# Patient Record
Sex: Male | Born: 1980 | Race: Black or African American | Hispanic: No | Marital: Married | Smoking: Never smoker
Health system: Southern US, Community
[De-identification: ages and names within clinical notes are randomized; demographics above are authoritative.]

## PROBLEM LIST (undated history)

## (undated) DIAGNOSIS — I10 Essential (primary) hypertension: Secondary | ICD-10-CM

## (undated) DIAGNOSIS — R079 Chest pain, unspecified: Principal | ICD-10-CM

## (undated) DIAGNOSIS — I319 Disease of pericardium, unspecified: Secondary | ICD-10-CM

## (undated) HISTORY — DX: Disease of pericardium, unspecified: I31.9

## (undated) HISTORY — DX: Chest pain, unspecified: R07.9

## (undated) HISTORY — PX: NO PAST SURGERIES: SHX2092

---

## 2014-10-24 ENCOUNTER — Emergency Department (HOSPITAL_COMMUNITY)
Admission: EM | Admit: 2014-10-24 | Discharge: 2014-10-24 | Disposition: A | Discharge status: Home / Self Care | Payer: Medicaid Other | Attending: Emergency Medicine | Admitting: Emergency Medicine

## 2014-10-24 ENCOUNTER — Encounter (HOSPITAL_COMMUNITY): Payer: Self-pay | Admitting: Emergency Medicine

## 2014-10-24 DIAGNOSIS — I10 Essential (primary) hypertension: Secondary | ICD-10-CM | POA: Insufficient documentation

## 2014-10-24 DIAGNOSIS — L299 Pruritus, unspecified: Secondary | ICD-10-CM | POA: Diagnosis present

## 2014-10-24 HISTORY — DX: Essential (primary) hypertension: I10

## 2014-10-24 LAB — CBC WITH DIFFERENTIAL/PLATELET
BASOS ABS: 0 10*3/uL (ref 0.0–0.1)
Basophils Relative: 0 %
EOS PCT: 22 %
Eosinophils Absolute: 1.1 10*3/uL — ABNORMAL HIGH (ref 0.0–0.7)
HEMATOCRIT: 43 % (ref 39.0–52.0)
HEMOGLOBIN: 14.8 g/dL (ref 13.0–17.0)
LYMPHS ABS: 2.5 10*3/uL (ref 0.7–4.0)
LYMPHS PCT: 53 %
MCH: 27.3 pg (ref 26.0–34.0)
MCHC: 34.4 g/dL (ref 30.0–36.0)
MCV: 79.3 fL (ref 78.0–100.0)
MONOS PCT: 7 %
Monocytes Absolute: 0.3 10*3/uL (ref 0.1–1.0)
NEUTROS PCT: 18 %
Neutro Abs: 0.9 10*3/uL — ABNORMAL LOW (ref 1.7–7.7)
Platelets: 228 10*3/uL (ref 150–400)
RBC: 5.42 MIL/uL (ref 4.22–5.81)
RDW: 12.9 % (ref 11.5–15.5)
WBC: 4.8 10*3/uL (ref 4.0–10.5)

## 2014-10-24 LAB — BASIC METABOLIC PANEL
ANION GAP: 8 (ref 5–15)
BUN: 11 mg/dL (ref 6–20)
CALCIUM: 9.2 mg/dL (ref 8.9–10.3)
CHLORIDE: 100 mmol/L — AB (ref 101–111)
CO2: 28 mmol/L (ref 22–32)
Creatinine, Ser: 0.9 mg/dL (ref 0.61–1.24)
GFR calc non Af Amer: >60 mL/min (ref >60)
GLUCOSE: 88 mg/dL (ref 65–99)
POTASSIUM: 3.6 mmol/L (ref 3.5–5.1)
Sodium: 136 mmol/L (ref 135–145)

## 2014-10-24 LAB — URINALYSIS, ROUTINE W REFLEX MICROSCOPIC
BILIRUBIN URINE: NEGATIVE
Glucose, UA: NEGATIVE mg/dL
Hgb urine dipstick: NEGATIVE
Ketones, ur: NEGATIVE mg/dL
LEUKOCYTES UA: NEGATIVE
NITRITE: NEGATIVE
PH: 6.5 (ref 5.0–8.0)
Protein, ur: NEGATIVE mg/dL
SPECIFIC GRAVITY, URINE: 1.023 (ref 1.005–1.030)
UROBILINOGEN UA: 0.2 mg/dL (ref 0.0–1.0)

## 2014-10-24 MED ORDER — PREDNISONE 10 MG PO TABS: 20.0000 mg | ORAL_TABLET | Freq: Two times a day (BID) | ORAL | Status: DC

## 2014-10-24 MED ORDER — PREDNISONE 20 MG PO TABS
60.0000 mg | ORAL_TABLET | Freq: Once | ORAL | Status: AC
  Administered 2014-10-24: 60 mg via ORAL
  Filled 2014-10-24: qty 3

## 2014-10-24 MED ORDER — HYDROXYZINE HCL 25 MG PO TABS: 25.0000 mg | ORAL_TABLET | Freq: Four times a day (QID) | ORAL | Status: DC

## 2014-10-24 MED ORDER — DIPHENHYDRAMINE HCL 25 MG PO CAPS
25.0000 mg | ORAL_CAPSULE | Freq: Once | ORAL | Status: AC
  Administered 2014-10-24: 25 mg via ORAL
  Filled 2014-10-24: qty 1

## 2014-10-24 NOTE — ED Notes (Signed)
Pt is with children in peds

## 2014-10-24 NOTE — ED Provider Notes (Signed)
CSN: [id number]     Arrival date & time 10/24/14  1329 History  By signing my name below, I, Peter Malone, attest that this documentation has been prepared under the direction and in the presence of Peter Baller, NP Electronically Signed: Erling Malone, ED Scribe. 10/26/2014. 8:56 PM.    Chief Complaint  Patient presents with  . Pruritis   The history is provided by the patient. No language interpreter was used.    HPI Comments: Peter Malone is a 34 y.o. male who presents to the Emergency Department complaining of itchy skin to his back onset 1 day. He states the rash is also located to his b/l upper arms, torso and feet. He began taking his new blood pressure medication 2 weeks ago; pt is unable to recall name of medication. He denies any new soaps, detergents or foods. [place] country 2 [place] from [place] and states he was in an area with poor water quality. Pt denies any h/o similar rashes. He denies any lesions, hives, throat swelling, tongue swelling, SOB or wheezing.  Patient's wife is here in the ED tonight with a migraine headache. Patient's daughter has been evaluated for constipation.  When patient arrived here 2 weeks ago he and his family went to a Clinic in [facility] for a check up.    Past Medical History  Diagnosis Date  . Hypertension    History reviewed. No pertinent past surgical history. History reviewed. No pertinent family history. Social History  Substance Use Topics  . Smoking status: Never Smoker   . Smokeless tobacco: None  . Alcohol Use: No    Review of Systems  HENT: Negative for facial swelling and trouble swallowing.   Respiratory: Negative for shortness of breath and wheezing.   Skin: Positive for rash.  All other systems reviewed and are negative.     Allergies  Review of patient's allergies indicates no known allergies.  Home Medications   Prior to Admission medications   Medication Sig Start Date End Date  Taking? Authorizing Provider  hydrOXYzine (ATARAX/VISTARIL) 25 MG tablet Take 1 tablet (25 mg total) by mouth every 6 (six) hours. 10/24/14   Peter Malone Bunnie Pion, NP  predniSONE (DELTASONE) 10 MG tablet Take 2 tablets (20 mg total) by mouth 2 (two) times daily with a meal. 10/24/14   Peter Malone Bunnie Pion, NP   Triage Vitals: BP 136/92 mmHg  Pulse 58  Temp(Src) 97.6 F (36.4 C) (Oral)  Resp 18  SpO2 99%  Physical Exam  Constitutional: He is oriented to person, place, and time. He appears well-developed and well-nourished.  HENT:  Head: Normocephalic and atraumatic.  Mouth/Throat: Uvula is midline and oropharynx is clear and moist. No posterior oropharyngeal edema.  Eyes: EOM are normal.  Neck: Neck supple.  Cardiovascular: Normal rate and regular rhythm.   Pulmonary/Chest: Effort normal and breath sounds normal.  Abdominal: Soft. There is no tenderness.  Musculoskeletal: Normal range of motion.  Neurological: He is alert and oriented to person, place, and time. No cranial nerve deficit.  Skin: Skin is warm and dry.  No rash noted on exam but patient scratching and complaining of itching.  Psychiatric: He has a normal mood and affect. His behavior is normal.  Nursing note and vitals reviewed.   ED Course  Procedures (including critical care time)  DIAGNOSTIC STUDIES: Oxygen Saturation is 99% on RA, normal by my interpretation.    COORDINATION OF CARE:    Labs Review Results for orders  placed or performed during the hospital encounter of 10/24/14 (from the past 72 hour(s))  CBC with Differential     Status: Abnormal   Collection Time: 10/24/14  4:26 PM  Result Value Ref Range   WBC 4.8 4.0 - 10.5 K/uL   RBC 5.42 4.22 - 5.81 MIL/uL   Hemoglobin 14.8 13.0 - 17.0 g/dL   HCT 43.0 39.0 - 52.0 %   MCV 79.3 78.0 - 100.0 fL   MCH 27.3 26.0 - 34.0 pg   MCHC 34.4 30.0 - 36.0 g/dL   RDW 12.9 11.5 - 15.5 %   Platelets 228 150 - 400 K/uL   Neutrophils Relative % 18 %   Lymphocytes Relative 53  %   Monocytes Relative 7 %   Eosinophils Relative 22 %   Basophils Relative 0 %   Neutro Abs 0.9 (L) 1.7 - 7.7 K/uL   Lymphs Abs 2.5 0.7 - 4.0 K/uL   Monocytes Absolute 0.3 0.1 - 1.0 K/uL   Eosinophils Absolute 1.1 (H) 0.0 - 0.7 K/uL   Basophils Absolute 0.0 0.0 - 0.1 K/uL   Smear Review MORPHOLOGY UNREMARKABLE   Basic metabolic panel     Status: Abnormal   Collection Time: 10/24/14  4:26 PM  Result Value Ref Range   Sodium 136 135 - 145 mmol/L   Potassium 3.6 3.5 - 5.1 mmol/L   Chloride 100 (L) 101 - 111 mmol/L   CO2 28 22 - 32 mmol/L   Glucose, Bld 88 65 - 99 mg/dL   BUN 11 6 - 20 mg/dL   Creatinine, Ser 0.90 0.61 - 1.24 mg/dL   Calcium 9.2 8.9 - 10.3 mg/dL   GFR calc non Af Amer >60 >60 mL/min   GFR calc Af Amer >60 >60 mL/min    Comment: (NOTE) The eGFR has been calculated using the CKD EPI equation. This calculation has not been validated in all clinical situations. eGFR's persistently <60 mL/min signify possible Chronic Kidney Disease.    Anion gap 8 5 - 15  Pathologist smear review     Status: None   Collection Time: 10/24/14  4:26 PM  Result Value Ref Range   Path Review Neutropenia     Comment: and reactive appearing lymphocytes. Reviewed by Peter Malone Lyndon Code, M.D. 10/25/14   Urinalysis, Routine w reflex microscopic     Status: None   Collection Time: 10/24/14  4:50 PM  Result Value Ref Range   Color, Urine YELLOW YELLOW   APPearance CLEAR CLEAR   Specific Gravity, Urine 1.023 1.005 - 1.030   pH 6.5 5.0 - 8.0   Glucose, UA NEGATIVE NEGATIVE mg/dL   Hgb urine dipstick NEGATIVE NEGATIVE   Bilirubin Urine NEGATIVE NEGATIVE   Ketones, ur NEGATIVE NEGATIVE mg/dL   Protein, ur NEGATIVE NEGATIVE mg/dL   Urobilinogen, UA 0.2 0.0 - 1.0 mg/dL   Nitrite NEGATIVE NEGATIVE   Leukocytes, UA NEGATIVE NEGATIVE    Comment: MICROSCOPIC NOT DONE ON URINES WITH NEGATIVE PROTEIN, BLOOD, LEUKOCYTES, NITRITE, OR GLUCOSE <1000 mg/dL.     MDM  35 y.o. male with itching of skin  and report of hives earlier stable for d/c without respiratory distress with O2 SAT 100% on R/A. No difficulty swallowing or other problems. Patient to call the doctor that started him on the BP medication 2 weeks ago and discuss what the medication is and if it could cause the itching. Patient agrees to follow up. Will treat with Atarax for itching and prednisone.   Final diagnoses:  Pruritic condition   I personally performed the services described in this documentation, which was scribed in my presence. The recorded information has been reviewed and is accurate.     E. Lopez, [place] 10/26/14 2103  Debby Freiberg, MD 10/31/14 [phone]

## 2014-10-24 NOTE — ED Notes (Signed)
Pt sts itching and having some drainage and whelp when scratching

## 2014-10-24 NOTE — Discharge Instructions (Signed)
Contact the doctor that gave you your blood pressure pills and see if they want to change them. Return here as needed.   Pruritus Pruritus is an itching feeling. There are many different conditions and factors that can make your skin itchy. Dry skin is one of the most common causes of itching. Most cases of itching do not require medical attention. Itchy skin can turn into a rash.  HOME CARE INSTRUCTIONS  Watch your pruritus for any changes. Take these steps to help with your condition:  Skin Care  Moisturize your skin as needed. A moisturizer that contains petroleum jelly is best for keeping moisture in your skin.  Take or apply medicines only as directed by your health care provider. This may include:  Corticosteroid cream.  Anti-itch lotions.  Oral anti-histamines.  Apply cool compresses to the affected areas.  Try taking a bath with:  Epsom salts. Follow the instructions on the packaging. You can get these at your local pharmacy or grocery store.  Baking soda. Pour a small amount into the bath as directed by your health care provider.  Colloidal oatmeal. Follow the instructions on the packaging. You can get this at your local pharmacy or grocery store.  Try applying baking soda paste to your skin. Stir water into baking soda until it reaches a paste-like consistency.   Do not scratch your skin.  Avoid hot showers or baths, which can make itching worse. A cold shower may help with itching as long as you use a moisturizer after.  Avoid scented soaps, detergents, and perfumes. Use gentle soaps, detergents, perfumes, and other cosmetic products. General Instructions  Avoid wearing tight clothes.  Keep a journal to help track what causes your itch. Write down:  What you eat.  What cosmetic products you use.  What you drink.  What you wear. This includes jewelry.  Use a humidifier. This keeps the air moist, which helps to prevent dry skin. SEEK MEDICAL CARE IF:  The  itching does not go away after several days.  You sweat at night.  You have weight loss.  You are unusually thirsty.  You urinate more than normal.  You are more tired than normal.  You have abdominal pain.  Your skin tingles.  You feel weak.  Your skin or the whites of your eyes look yellow (jaundice).  Your skin feels numb.   This information is not intended to replace advice given to you by your health care provider. Make sure you discuss any questions you have with your health care provider.   Document Released: 09/12/2010 Document Revised: 05/17/2014 Document Reviewed: 12/27/2013 Elsevier Interactive Patient Education [employer]

## 2014-10-25 LAB — PATHOLOGIST SMEAR REVIEW

## 2014-11-22 DIAGNOSIS — I10 Essential (primary) hypertension: Secondary | ICD-10-CM

## 2014-11-22 NOTE — Congregational Nurse Program (Signed)
Congregational Nurse Program Note  Date of Encounter: 11/22/2014  Past Medical History: Past Medical History  Diagnosis Date  . Hypertension     Encounter Details: Office visit at [facility] requesting blood pressure check. History of hypertension treatment and currently not on medication since arrival [place] [address] [place] [employer]. Currently unfamiliar with PCP assigned on Medicaid information. Blood pressure 145/94. Pulse 75. Plan: [facility] to [facility] for primary care. Exercise weekly;walking. No added salt to diet. Return 1 week for B/P recheck.

## 2015-03-08 ENCOUNTER — Emergency Department (INDEPENDENT_AMBULATORY_CARE_PROVIDER_SITE_OTHER): Payer: Medicaid Other

## 2015-03-08 ENCOUNTER — Encounter (HOSPITAL_COMMUNITY): Payer: Self-pay | Admitting: *Deleted

## 2015-03-08 ENCOUNTER — Emergency Department (HOSPITAL_COMMUNITY)
Admission: EM | Admit: 2015-03-08 | Discharge: 2015-03-08 | Disposition: A | Discharge status: Home / Self Care | Payer: Medicaid Other | Source: Home / Self Care | Attending: Family Medicine | Admitting: Family Medicine

## 2015-03-08 DIAGNOSIS — I10 Essential (primary) hypertension: Secondary | ICD-10-CM

## 2015-03-08 DIAGNOSIS — J069 Acute upper respiratory infection, unspecified: Secondary | ICD-10-CM | POA: Diagnosis not present

## 2015-03-08 LAB — POCT I-STAT, CHEM 8
BUN: 11 mg/dL (ref 6–20)
CALCIUM ION: 1.17 mmol/L (ref 1.12–1.23)
CHLORIDE: 102 mmol/L (ref 101–111)
CREATININE: 0.8 mg/dL (ref 0.61–1.24)
GLUCOSE: 84 mg/dL (ref 65–99)
HCT: 45 % (ref 39.0–52.0)
Hemoglobin: 15.3 g/dL (ref 13.0–17.0)
Potassium: 3.3 mmol/L — ABNORMAL LOW (ref 3.5–5.1)
Sodium: 141 mmol/L (ref 135–145)
TCO2: 29 mmol/L (ref 0–100)

## 2015-03-08 MED ORDER — GUAIFENESIN-CODEINE 100-10 MG/5ML PO SYRP: 10.0000 mL | ORAL_SOLUTION | Freq: Four times a day (QID) | ORAL | Status: DC | PRN

## 2015-03-08 MED ORDER — AMLODIPINE BESYLATE 10 MG PO TABS: 10.0000 mg | ORAL_TABLET | Freq: Every day | ORAL | Status: DC

## 2015-03-08 NOTE — ED Notes (Signed)
Pt  Has  A  Cough   - he  Also  Has  A  History    Of hypertension      He  Takes  No  meds        He  Reports  Body  aches  As  Well    He  Reports  The  Symptoms    X  4  Days     He  Is  Sitting  Upright on the  Exam table  Speaking in   Complete  sentances  And  Appears  In no  Severe  Distress

## 2015-03-08 NOTE — Discharge Instructions (Signed)
See your doctor in 1-2 weeks for recheck of bp and cough

## 2015-03-08 NOTE — ED Provider Notes (Signed)
CSN: [id number]     Arrival date & time 03/08/15  1306 History   First MD Initiated Contact with Patient 03/08/15 1331     Chief Complaint  Patient presents with  . URI   (Consider location/radiation/quality/duration/timing/severity/associated sxs/prior Treatment) Patient is a 35 y.o. male presenting with URI. The history is provided by the patient.  URI Presenting symptoms: cough   Presenting symptoms: no fever   Severity:  Moderate Onset quality:  Gradual Duration:  4 days Chronicity:  New Relieved by:  None tried Worsened by:  Nothing tried Ineffective treatments:  None tried Associated symptoms: no wheezing   Risk factors comment:  Hypertension treated in [place], no rx in [place]. no smoking.   Past Medical History  Diagnosis Date  . Hypertension    History reviewed. No pertinent past surgical history. History reviewed. No pertinent family history. Social History  Substance Use Topics  . Smoking status: Never Smoker   . Smokeless tobacco: None  . Alcohol Use: No    Review of Systems  Constitutional: Negative.  Negative for fever.  HENT: Negative.   Respiratory: Positive for cough. Negative for wheezing.   Cardiovascular: Positive for chest pain. Negative for leg swelling.  All other systems reviewed and are negative.   Allergies  Review of patient's allergies indicates no known allergies.  Home Medications   Prior to Admission medications   Medication Sig Start Date End Date Taking? Authorizing Provider  amLODipine (NORVASC) 10 MG tablet Take 1 tablet (10 mg total) by mouth daily. 03/08/15   Linna Hoff, MD  guaiFENesin-codeine [facility]) 100-10 MG/5ML syrup Take 10 mLs by mouth 4 (four) times daily as needed for cough. 03/08/15   Linna Hoff, MD  hydrOXYzine (ATARAX/VISTARIL) 25 MG tablet Take 1 tablet (25 mg total) by mouth every 6 (six) hours. 10/24/14   Hope Orlene Och, NP  predniSONE (DELTASONE) 10 MG tablet Take 2 tablets (20 mg total) by mouth 2 (two)  times daily with a meal. 10/24/14   Hope Orlene Och, NP   Meds Ordered and Administered this Visit  Medications - No data to display  BP 181/91 mmHg  Pulse 72  Temp(Src) 98.2 F (36.8 C) (Oral)  Resp 18  SpO2 100% No data found.   Physical Exam  Constitutional: He is oriented to person, place, and time. He appears well-developed and well-nourished. No distress.  HENT:  Right Ear: External ear normal.  Left Ear: External ear normal.  Mouth/Throat: Oropharynx is clear and moist.  Neck: Normal range of motion. Neck supple.  Cardiovascular: Regular rhythm, normal heart sounds and intact distal pulses.   Pulmonary/Chest: Effort normal.  Musculoskeletal: He exhibits no edema.  Neurological: He is alert and oriented to person, place, and time. He has normal reflexes.  Skin: Skin is warm and dry.  Nursing note and vitals reviewed.   ED Course  Procedures (including critical care time)  Labs Review Labs Reviewed  POCT I-STAT, CHEM 8 - Abnormal; Notable for the following:    Potassium 3.3 (*)    All other components within normal limits   i-stat k3.3  Imaging Review Dg Chest 2 View  03/08/2015  CLINICAL DATA:  Hypertension. EXAM: CHEST  2 VIEW COMPARISON:  None. FINDINGS: Heart size is normal. Mediastinal shadows are normal. The lungs are clear. No bronchial thickening. No infiltrate, mass, effusion or collapse. Pulmonary vascularity is normal. No bony abnormality. IMPRESSION: Normal chest Electronically Signed   By: Paulina Fusi M.D.   On:  03/08/2015 14:07   X-rays reviewed and report per radiologist.   Visual Acuity Review  Right Eye Distance:   Left Eye Distance:   Bilateral Distance:    Right Eye Near:   Left Eye Near:    Bilateral Near:         MDM   1. URI (upper respiratory infection)   2. Essential hypertension    Meds ordered this encounter  Medications  . amLODipine (NORVASC) 10 MG tablet    Sig: Take 1 tablet (10 mg total) by mouth daily.     Dispense:  30 tablet    Refill:  1  . guaiFENesin-codeine (ROBITUSSIN AC) 100-10 MG/5ML syrup    Sig: Take 10 mLs by mouth 4 (four) times daily as needed for cough.    Dispense:  180 mL    Refill:  0       Linna Hoff, MD 03/08/15 1421

## 2015-03-15 DIAGNOSIS — I319 Disease of pericardium, unspecified: Secondary | ICD-10-CM

## 2015-03-15 HISTORY — DX: Disease of pericardium, unspecified: I31.9

## 2015-03-17 ENCOUNTER — Other Ambulatory Visit: Payer: Self-pay

## 2015-03-17 ENCOUNTER — Ambulatory Visit: Payer: Medicaid Other | Attending: Internal Medicine | Admitting: Physician Assistant

## 2015-03-17 ENCOUNTER — Encounter: Payer: Self-pay | Admitting: Physician Assistant

## 2015-03-17 VITALS — BP 147/98 | HR 77 | Temp 97.5°F | Resp 16 | Ht 69.0 in | Wt 172.0 lb

## 2015-03-17 DIAGNOSIS — Z79899 Other long term (current) drug therapy: Secondary | ICD-10-CM | POA: Diagnosis not present

## 2015-03-17 DIAGNOSIS — E876 Hypokalemia: Secondary | ICD-10-CM | POA: Insufficient documentation

## 2015-03-17 DIAGNOSIS — R0782 Intercostal pain: Secondary | ICD-10-CM

## 2015-03-17 DIAGNOSIS — I309 Acute pericarditis, unspecified: Secondary | ICD-10-CM | POA: Insufficient documentation

## 2015-03-17 DIAGNOSIS — J069 Acute upper respiratory infection, unspecified: Secondary | ICD-10-CM | POA: Diagnosis not present

## 2015-03-17 DIAGNOSIS — I10 Essential (primary) hypertension: Secondary | ICD-10-CM | POA: Diagnosis not present

## 2015-03-17 LAB — CBC WITH DIFFERENTIAL/PLATELET
Basophils Absolute: 0 10*3/uL (ref 0.0–0.1)
Basophils Relative: 0 % (ref 0–1)
Eosinophils Absolute: 0.4 10*3/uL (ref 0.0–0.7)
Eosinophils Relative: 9 % — ABNORMAL HIGH (ref 0–5)
HEMATOCRIT: 41.9 % (ref 39.0–52.0)
HEMOGLOBIN: 14.1 g/dL (ref 13.0–17.0)
LYMPHS ABS: 1.6 10*3/uL (ref 0.7–4.0)
LYMPHS PCT: 42 % (ref 12–46)
MCH: 27.2 pg (ref 26.0–34.0)
MCHC: 33.7 g/dL (ref 30.0–36.0)
MCV: 80.9 fL (ref 78.0–100.0)
MONO ABS: 0.4 10*3/uL (ref 0.1–1.0)
MONOS PCT: 10 % (ref 3–12)
MPV: 9.5 fL (ref 8.6–12.4)
NEUTROS ABS: 1.5 10*3/uL — AB (ref 1.7–7.7)
NEUTROS PCT: 39 % — AB (ref 43–77)
PLATELETS: 290 10*3/uL (ref 150–400)
RBC: 5.18 MIL/uL (ref 4.22–5.81)
RDW: 13.6 % (ref 11.5–15.5)
WBC: 3.9 10*3/uL — ABNORMAL LOW (ref 4.0–10.5)

## 2015-03-17 LAB — BASIC METABOLIC PANEL
BUN: 15 mg/dL (ref 7–25)
CALCIUM: 9.5 mg/dL (ref 8.6–10.3)
CHLORIDE: 105 mmol/L (ref 98–110)
CO2: 27 mmol/L (ref 20–31)
Creat: 0.79 mg/dL (ref 0.60–1.35)
GLUCOSE: 80 mg/dL (ref 65–99)
POTASSIUM: 3.6 mmol/L (ref 3.5–5.3)
SODIUM: 139 mmol/L (ref 135–146)

## 2015-03-17 LAB — HEPATIC FUNCTION PANEL
ALT: 15 U/L (ref 9–46)
AST: 30 U/L (ref 10–40)
Albumin: 4.4 g/dL (ref 3.6–5.1)
Alkaline Phosphatase: 64 U/L (ref 40–115)
BILIRUBIN DIRECT: 0.1 mg/dL (ref <=0.2)
BILIRUBIN INDIRECT: 0.5 mg/dL (ref 0.2–1.2)
BILIRUBIN TOTAL: 0.6 mg/dL (ref 0.2–1.2)
Total Protein: 7.3 g/dL (ref 6.1–8.1)

## 2015-03-17 LAB — C-REACTIVE PROTEIN: CRP: 0.7 mg/dL — AB (ref <0.60)

## 2015-03-17 MED ORDER — METHYLPREDNISOLONE SODIUM SUCC 125 MG IJ SOLR: 125.0000 mg | Freq: Once | INTRAMUSCULAR | Status: DC

## 2015-03-17 MED ORDER — COLCHICINE 0.6 MG PO TABS: 0.6000 mg | ORAL_TABLET | Freq: Two times a day (BID) | ORAL | Status: DC

## 2015-03-17 MED ORDER — AMLODIPINE BESYLATE 10 MG PO TABS: 10.0000 mg | ORAL_TABLET | Freq: Every day | ORAL | Status: DC

## 2015-03-17 MED ORDER — MELOXICAM 15 MG PO TABS: 15.0000 mg | ORAL_TABLET | Freq: Every day | ORAL | Status: DC

## 2015-03-17 MED ORDER — IBUPROFEN 800 MG PO TABS: 800.0000 mg | ORAL_TABLET | Freq: Three times a day (TID) | ORAL | Status: DC

## 2015-03-17 MED ORDER — SODIUM CHLORIDE 0.9 % IV SOLN
125.0000 mg | Freq: Once | INTRAVENOUS | Status: AC
  Administered 2015-03-17: 130 mg via INTRAMUSCULAR

## 2015-03-17 NOTE — Progress Notes (Signed)
Peter Malone, is a 35 y.o. male  ZOX:[id number]  [id number]  DOB - February 05, 1980  Chief Complaint  Patient presents with  . URI  . Hypertension  . Follow-up        Subjective:   Peter Malone is a 35 y.o. male here today for a follow up visit from URI and htn diagnosed in the ED 03/08/2015. His URI/coughing is almost completely resolved. He is tolerating amlodipine without adverse SE.  He was found to have K+ of 3.3 at the hospital.  CXR was normal.  I do not see that an EKG was performed.  He is also c/o L sided CP that has been going on for several months.  It is worse with certain movements.  He denies SOB, jaw, or arm pain.  Pain unchanged by position changes. He is also here to establish care.  Swahili interpreter is used by phone. He does not need a refill on his amlodipine currently. His FH is negative for early cardiac events.  No weakness, paresthesias.  Patient has No headache.  No abdominal pain - No Nausea, No new weakness tingling or numbness, No Cough - SOB.     ALLERGIES: No Known Allergies  PAST MEDICAL HISTORY: Past Medical History  Diagnosis Date  . Hypertension     MEDICATIONS AT HOME: Prior to Admission medications   Medication Sig Start Date End Date Taking? Authorizing Provider  amLODipine (NORVASC) 10 MG tablet Take 1 tablet (10 mg total) by mouth daily. 03/17/15  Yes Anders SimmondsAngela M McClung, PA-C  colchicine 0.6 MG tablet Take 1 tablet (0.6 mg total) by mouth 2 (two) times daily. 03/17/15   Anders SimmondsAngela M McClung, PA-C  ibuprofen (ADVIL,MOTRIN) 800 MG tablet Take 1 tablet (800 mg total) by mouth 3 (three) times daily. 03/17/15   Anders SimmondsAngela M McClung, PA-C     Objective:   Filed Vitals:   03/17/15 1028  BP: 147/98  Pulse: 77  Temp: 97.5 F (36.4 C)  TempSrc: Oral  Resp: 16  Height: 5\' 9"  (1.753 m)  Weight: 172 lb (78.019 kg)  SpO2: 100%    Exam General appearance : Awake, alert, not in any distress. Speech Clear. Not toxic looking HEENT: Atraumatic and  Normocephalic, pupils equally reactive to light and accomodation Neck: supple, no JVD. No cervical lymphadenopathy.  Chest:Good air entry bilaterally, no added sounds  CVS: S1 S2 regular, no murmurs. No heave, no thrill.  Abdomen: Bowel sounds present, Non tender and not distended with no gaurding, rigidity or rebound. Extremities: B/L Lower Ext shows no edema, both legs are warm to touch Neurology: Awake alert, and oriented X 3, CN II-XII intact, Non focal Skin:No Rash  Data Review No results found for: HGBA1C   Assessment & Plan   1. Hypokalemia - Basic metabolic panel - EKG 12-Lead - Echocardiogram; Future  2. Essential hypertension Continue Amlodipine - Basic metabolic panel - EKG 12-Lead - Hepatic Function Panel - Ambulatory referral to Cardiac Electrophysiology  3. Intercostal pain - EKG 12-Lead  4. Acute pericarditis Diffuse ST segment elevation and high voltage QRS consistent with Acute pericarditis - Echocardiogram; Future - CBC with Differential/Platelet - C-reactive protein - Sedimentation Rate - Hepatic Function Panel - Ambulatory referral to Cardiologist - methylPREDNISolone sodium succinate (SOLU-MEDROL) 130 mg in sodium chloride 0.9 % 50 mL IVPB; Inject 130 mg into the muscle once. Per uptodate, we will initiate Colchicine and Ibuprofen and plan on seeing him back next week.  He is able to read and understand [place]  and speaks broken [place].  A Swahili interpreter was used for the patient interaction.  CP warnings were discussed at length.     I discussed the patient care, EKG, and plan with Dr. Hyman Hopes  Patient have been counseled extensively about nutrition and exercise  Recheck with me in 1 week per Dr. Hyman Hopes.   The patient was given clear instructions to go to ER or return to medical center if symptoms don't improve, worsen or new problems develop. The patient verbalized understanding. The patient was told to call to get lab results if they  haven't heard anything in the next week.    Georgian Co, PA-C [facility] [place], [place] [phone]   03/17/2015, 12:10 PM

## 2015-03-17 NOTE — Patient Instructions (Addendum)
Pericarditis °Pericarditis is swelling (inflammation) of the pericardium. The pericardium is a thin, double-layered, fluid-filled tissue sac that surrounds the heart. The purpose of the pericardium is to contain the heart in the chest cavity and keep the heart from overexpanding. Different types of pericarditis can occur, such as: °· Acute pericarditis. Inflammation can develop suddenly in acute pericarditis. °· Chronic pericarditis. Inflammation develops gradually and is long-lasting in chronic pericarditis. °· Constrictive pericarditis. In this type of pericarditis, the layers of the pericardium stiffen and develop scar tissue. The scar tissue thickens and sticks together. This makes it difficult for the heart to pump and work as it normally does. °CAUSES  °Pericarditis can be caused from different conditions, such as: °· A bacterial, fungal or viral infection. °· After a heart attack (myocardial infarction). °· After open-heart surgery (coronary bypass graft surgery). °· Auto-immune conditions such as lupus, rheumatoid arthritis or scleroderma. °· Kidney failure. °· Low thyroid condition (hypothyroidism). °· Cancer from another part of the body that has spread (metastasized) to the pericardium. °· Chest injury or trauma. °· After radiation treatment. °· Certain medicines. °SYMPTOMS  °Symptoms of pericarditis can include: °· Chest pain. Chest pain symptoms may increase when laying down and may be relieved when sitting up and leaning forward. °· A chronic, dry cough. °· Heart palpitations. These may feel like rapid, fluttering or pounding heart beats. °· Chest pain may be worse when swallowing. °· Dizziness or fainting. °· Tiredness, fatigue or lethargy. °· Fever. °DIAGNOSIS  °Pericarditis is diagnosed by the following: °· A physical exam. A heart sound called a pericardial friction rub may be heard when your caregiver listens to your heart. °· Blood work. Blood may be drawn to check for an infection and to look at  your blood chemistry. °· Electrocardiography. During electrocardiography your heart's electrical activity is monitored and recorded with a tracing on paper (electrocardiogram [ECG]). °· Echocardiography. °· Computed tomography (CT). °· Magnetic resonance image (MRI). °TREATMENT  °To treat pericarditis, it is important to know the cause of it. The cause of pericarditis determines the treatment.  °· If the cause of pericarditis is due to an infection, treatment is based on the type of infection. If an infection is suspected in the pericardial fluid, a procedure called a pericardial fluid culture and biopsy may be done. This takes a sample of the pericardial fluid. The sample is sent to a lab which runs tests on the pericardial fluid to check for an infection. °· If the autoimmune disease is the cause, treatment of the autoimmune condition will help improve the pericarditis. °· If the cause of pericarditis is not known, anti-inflammatory medicines may be used to help decrease the inflammation. °· Surgery may be needed. The following are types of surgeries or procedures that may be done to treat pericarditis: °¨ Pericardial window. A pericardial window makes a cut (incision) into the pericardial sac. This allows excess fluid in the pericardium to drain. °¨ Pericardiocentesis. A pericardiocentesis is also known as a pericardial tap. This procedure uses a needle that is guided by X-ray to drain (aspirate) excess fluid from the pericardium. °¨ Pericardiectomy. A pericardiectomy removes part or all of the pericardium. °HOME CARE INSTRUCTIONS  °· Do not smoke. If you smoke, quit. Your caregiver can help you quit smoking. °· Maintain a healthy weight. °· Follow an exercise program as directed by your health care provider. You may need to limit your exercising until your symptoms go away. °· If you drink alcohol, do so in moderation. °·   Eat a heart healthy diet. A registered dietitian can help you learn about healthy food  choices. °· Keep a list of all your medicines with you at all times. Include the name, dose, how often it is taken and how it is taken. °SEEK IMMEDIATE MEDICAL CARE IF:  °· You have chest pain or feelings of chest pressure. °· You have sweating (diaphoresis) when at rest. °· You have irregular heartbeats (palpitations). °· You have rapid, racing heart beats. °· You have unexplained fainting episodes. °· You feel sick to your stomach (nausea) or vomiting without cause. °· You have unexplained weakness. °If you develop any of the symptoms which originally made you seek care, call for local emergency medical help. Do not drive yourself to the hospital. °  °This information is not intended to replace advice given to you by your health care provider. Make sure you discuss any questions you have with your health care provider. °  °Document Released: 06/26/2000 Document Revised: 05/17/2014 Document Reviewed: 07/13/2014 °Elsevier Interactive Patient Education ©2016 [employer] ° °

## 2015-03-17 NOTE — Progress Notes (Signed)
Patient's here for hospital f/up hypertension and URI. Patient state he feels fine, denies any pain no further concerns.  Patient would like provider to address URI diagnosis.  Patient is here to est. care with PCP  Patient has had his flu shot. Patient decline diabetes screening.

## 2015-03-18 LAB — SEDIMENTATION RATE: Sed Rate: 1 mm/hr (ref 0–15)

## 2015-03-21 ENCOUNTER — Ambulatory Visit (HOSPITAL_COMMUNITY): Admission: RE | Admit: 2015-03-21 | Payer: Medicaid Other | Source: Ambulatory Visit

## 2015-03-23 ENCOUNTER — Emergency Department (HOSPITAL_COMMUNITY)
Admission: EM | Admit: 2015-03-23 | Discharge: 2015-03-23 | Disposition: A | Discharge status: Home / Self Care | Payer: Medicaid Other | Attending: Emergency Medicine | Admitting: Emergency Medicine

## 2015-03-23 ENCOUNTER — Encounter: Payer: Self-pay | Admitting: Physician Assistant

## 2015-03-23 ENCOUNTER — Encounter (HOSPITAL_COMMUNITY): Payer: Self-pay | Admitting: Cardiology

## 2015-03-23 ENCOUNTER — Ambulatory Visit: Payer: Medicaid Other | Attending: Internal Medicine | Admitting: Physician Assistant

## 2015-03-23 ENCOUNTER — Emergency Department (HOSPITAL_COMMUNITY): Payer: Medicaid Other

## 2015-03-23 VITALS — BP 138/87 | HR 79 | Temp 97.4°F | Resp 16 | Ht 69.0 in | Wt 172.0 lb

## 2015-03-23 DIAGNOSIS — I309 Acute pericarditis, unspecified: Secondary | ICD-10-CM | POA: Diagnosis not present

## 2015-03-23 DIAGNOSIS — E876 Hypokalemia: Secondary | ICD-10-CM | POA: Diagnosis not present

## 2015-03-23 DIAGNOSIS — R079 Chest pain, unspecified: Secondary | ICD-10-CM | POA: Insufficient documentation

## 2015-03-23 DIAGNOSIS — R7982 Elevated C-reactive protein (CRP): Secondary | ICD-10-CM

## 2015-03-23 DIAGNOSIS — R040 Epistaxis: Secondary | ICD-10-CM | POA: Diagnosis not present

## 2015-03-23 DIAGNOSIS — I10 Essential (primary) hypertension: Secondary | ICD-10-CM | POA: Diagnosis not present

## 2015-03-23 DIAGNOSIS — R011 Cardiac murmur, unspecified: Secondary | ICD-10-CM | POA: Insufficient documentation

## 2015-03-23 DIAGNOSIS — D703 Neutropenia due to infection: Secondary | ICD-10-CM

## 2015-03-23 LAB — BASIC METABOLIC PANEL
ANION GAP: 11 (ref 5–15)
BUN: 12 mg/dL (ref 6–20)
CHLORIDE: 105 mmol/L (ref 101–111)
CO2: 24 mmol/L (ref 22–32)
Calcium: 9.5 mg/dL (ref 8.9–10.3)
Creatinine, Ser: 0.68 mg/dL (ref 0.61–1.24)
GFR calc non Af Amer: >60 mL/min (ref >60)
Glucose, Bld: 83 mg/dL (ref 65–99)
POTASSIUM: 3.4 mmol/L — AB (ref 3.5–5.1)
SODIUM: 140 mmol/L (ref 135–145)

## 2015-03-23 LAB — CBC
HEMATOCRIT: 40.5 % (ref 39.0–52.0)
HEMOGLOBIN: 14.4 g/dL (ref 13.0–17.0)
MCH: 28.3 pg (ref 26.0–34.0)
MCHC: 35.6 g/dL (ref 30.0–36.0)
MCV: 79.7 fL (ref 78.0–100.0)
PLATELETS: 258 10*3/uL (ref 150–400)
RBC: 5.08 MIL/uL (ref 4.22–5.81)
RDW: 12.7 % (ref 11.5–15.5)
WBC: 4.6 10*3/uL (ref 4.0–10.5)

## 2015-03-23 LAB — I-STAT TROPONIN, ED: TROPONIN I, POC: 0 ng/mL (ref 0.00–0.08)

## 2015-03-23 NOTE — Patient Instructions (Signed)
Go directly to [facility] for Chest pain and Shortness of breath due to Pericarditis.

## 2015-03-23 NOTE — ED Notes (Signed)
Pt reports that he has had central chest pain that started about 2 weeks ago. Reports he was sent here from Cardiology office for possible pericarditis. Pt reports pain with deep inspiration.

## 2015-03-23 NOTE — Progress Notes (Signed)
Patient ID: Quamel Fitzmaurice, male   DOB: 07/27/1980, 35 y.o.   MRN: [id number]   Alif Petrak, is a 35 y.o. male  UYQ:[id number]  GLO:[id number]  DOB - May 20, 1980  Chief Complaint  Patient presents with  . Follow-up  . Chest Pain        Subjective:   Dio Giller is a 35 y.o. male here today for a follow up visit for acute pericarditis.  He is taking colchicine and ibuprofen.  He continues to have CP in the L side of his chest and is now experiencing occasional SOB that is worse with exertion. No edema.  Pain is relieved when sitting up and leaning forward.  He has also been experiencing nose bleeds almost daily for a few months.  I had advised him to try using saline nasal spray and vaseline intranasally at his last visit, but he did not pick up either from the pharmacy.   Patient has No headache, No abdominal pain - No Nausea, No new weakness tingling or numbness, No Cough.  No problems updated.  ALLERGIES: No Known Allergies  PAST MEDICAL HISTORY: Past Medical History  Diagnosis Date  . Hypertension     MEDICATIONS AT HOME: Prior to Admission medications   Medication Sig Start Date End Date Taking? Authorizing Provider  amLODipine (NORVASC) 10 MG tablet Take 1 tablet (10 mg total) by mouth daily. 03/17/15  Yes Anders Simmonds, PA-C  colchicine 0.6 MG tablet Take 1 tablet (0.6 mg total) by mouth 2 (two) times daily. 03/17/15  Yes Marzella Schlein McClung, PA-C  ibuprofen (ADVIL,MOTRIN) 800 MG tablet Take 1 tablet (800 mg total) by mouth 3 (three) times daily. 03/17/15  Yes Anders Simmonds, PA-C     Objective:   Filed Vitals:   03/23/15 1033  BP: 138/87  Pulse: 79  Temp: 97.4 F (36.3 C)  TempSrc: Oral  Resp: 16  Height:  (1.753 m)  Weight: 172 lb (78.019 kg)  SpO2: 98%    Exam General appearance : Awake, alert, not in any distress. Speech Clear. Not toxic looking HEENT: Atraumatic and Normocephalic, pupils equally reactive to light and accomodation.  Scabbing present  in L nares along septum.  Neck: supple, no JVD. No cervical lymphadenopathy.  Chest:Good air entry bilaterally, no added sounds  CVS: S1 S2 regular, and new onset 2-3/6 murmur best heard at apex. No audible rub  Abdomen: Bowel sounds present, Non tender and not distended with no gaurding, rigidity or rebound. Extremities: B/L Lower Ext shows no edema, both legs are warm to touch Neurology: Awake alert, and oriented X 3, CN II-XII intact, Non focal Skin:No Rash    Assessment & Plan   1. Neutropenia associated with infection (HCC) Will continue to monitor  2. Acute pericarditis Patient failed to go for his echocardiogram appointment.  Cardiologist appt is 04/06/2015. To ER now due to new onset SOB and murmur and we are unable to get him in for an Urgent Echo today.  Discussed with Dr. Hyman Hopes. Continue Ibuprofen and Colchicine  3. Hypokalemia-resolved  4. Essential hypertension Improving control.  Continue Amlodipine  5. Elevated C-reactive protein (CRP) -secondary to pericarditis-will follow  6.  Epistaxis-saline nasal spray and vaseline intranasally bid and we will rechck this next week.  He may need a referral to ENT.  Platelets are stable.     Patient have been counseled extensively about nutrition and exercise  Recheck with me in 1 week.  The patient was given clear instructions to go  to ER or return to medical center if symptoms don't improve, worsen or new problems develop. The patient verbalized understanding. The patient was told to call to get lab results if they haven't heard anything in the next week.      Angela McClung, PA-C [facility] [place], [place] [phone]   03/23/2015, 11:19 AM

## 2015-03-23 NOTE — Progress Notes (Signed)
Patient's here for f/up HTN, and Pleuritic chest pain. Patient states he's stilling have chest pain on left side located under his left ribs.  Pain rated 4/10  hurts with breathing. He reports pain starting 1 day ago.  Patient reports taken his meds today  Patient declines A1c screening.

## 2015-04-04 DIAGNOSIS — I10 Essential (primary) hypertension: Secondary | ICD-10-CM | POA: Insufficient documentation

## 2015-04-05 NOTE — Progress Notes (Signed)
No show

## 2015-04-06 ENCOUNTER — Encounter: Payer: Medicaid Other | Admitting: Cardiology

## 2015-04-06 ENCOUNTER — Encounter: Payer: Self-pay | Admitting: *Deleted

## 2015-04-10 ENCOUNTER — Telehealth: Payer: Self-pay | Admitting: Cardiology

## 2015-04-10 ENCOUNTER — Encounter: Payer: Self-pay | Admitting: Cardiology

## 2015-04-10 NOTE — Telephone Encounter (Signed)
ERROR. Sending no show letter

## 2015-04-18 ENCOUNTER — Encounter: Payer: Self-pay | Admitting: Cardiology

## 2015-05-16 ENCOUNTER — Ambulatory Visit: Payer: Medicaid Other | Admitting: Cardiology

## 2015-05-31 NOTE — Progress Notes (Signed)
This encounter was created in error - please disregard.

## 2015-06-01 ENCOUNTER — Encounter: Payer: Medicaid Other | Admitting: Cardiology

## 2015-07-03 ENCOUNTER — Encounter: Payer: Self-pay | Admitting: Cardiovascular Disease

## 2015-07-03 ENCOUNTER — Ambulatory Visit (INDEPENDENT_AMBULATORY_CARE_PROVIDER_SITE_OTHER): Payer: Medicaid Other | Admitting: Cardiovascular Disease

## 2015-07-03 VITALS — BP 157/103 | HR 72 | Ht 60.0 in | Wt 175.6 lb

## 2015-07-03 DIAGNOSIS — I209 Angina pectoris, unspecified: Secondary | ICD-10-CM

## 2015-07-03 DIAGNOSIS — R079 Chest pain, unspecified: Secondary | ICD-10-CM | POA: Insufficient documentation

## 2015-07-03 DIAGNOSIS — I319 Disease of pericardium, unspecified: Secondary | ICD-10-CM

## 2015-07-03 HISTORY — DX: Chest pain, unspecified: R07.9

## 2015-07-03 LAB — C-REACTIVE PROTEIN: CRP: <0.5 mg/dL (ref <0.60)

## 2015-07-03 MED ORDER — IBUPROFEN 800 MG PO TABS: 800.0000 mg | ORAL_TABLET | Freq: Three times a day (TID) | ORAL | Status: DC | PRN

## 2015-07-03 MED ORDER — HYDROCHLOROTHIAZIDE 25 MG PO TABS: 25.0000 mg | ORAL_TABLET | Freq: Every day | ORAL | Status: DC

## 2015-07-03 MED ORDER — COLCHICINE 0.6 MG PO CAPS: ORAL_CAPSULE | ORAL | Status: DC

## 2015-07-03 NOTE — Patient Instructions (Addendum)
Medication Instructions:  START HYDROCHLOROTHIAZIDE 25 MG DAILY  START COLCHICINE 0.6 MG DAILY  START IBUPROFEN 800 MG THREE TIMES A DAY AS NEEDED   Labwork: CRP AT [facility] ON THE FIRST FLOOR  Testing/Procedures: Your physician has requested that you have en exercise stress myoview. For further information please visit [email or web address]. Please follow instruction sheet, as given.  Your physician has requested that you have an echocardiogram. Echocardiography is a painless test that uses sound waves to create images of your heart. It provides your doctor with information about the size and shape of your heart and how well your heart's chambers and valves are working. This procedure takes approximately one hour. There are no restrictions for this procedure. [facility] AT [address]  Follow-Up: Your physician recommends that you schedule a follow-up appointment in: 1 MONTH   GET OVER THE COUNTER SALINE NASAL SPRAY TO USE AS NEEDED    If you need a refill on your cardiac medications before your next appointment, please call your pharmacy.

## 2015-07-03 NOTE — Progress Notes (Signed)
Cardiology Office Note   Date:  07/03/2015   ID:  Peter Malone, DOB 23-Nov-1980, MRN [id number]  PCP:  No PCP Per Patient  Cardiologist:   Chilton Si, MD   Chief Complaint  Patient presents with  . New Patient (Initial Visit)    patient here for blood pressure. he has been off of amlodipine for 1 month.     History of Present Illness: Peter Malone is a 35 y.o. male with hypertension and pericarditis who presents for an evaluation of chest pain.  He reports having chest pain for three months.  The pain is 6/10, sharp, and comes and goes.  It is worse at worse when he is working.  He works at a [employer] and has to lift heavy items.  He exercises playing basketball and does not get pain with this type of exertion.  It is not worse when laying down or leaning forward but it is pleurtic. The episodes last for an entire day.  There is mild shortness of breath but no nausea or diaphoresis.   He denies lower extremity edema or PND.   He reports mild orthopnea when he has the chest pain but not otherwise.   In March 2017 Peter Malone was diagnosed with pericarditis that was managed with colchicine and ibuprofen.  At the time he had anterior ST elevation (also LVH with repolarization abnormality).  CRP was mildly elevated at 0.7.  He does not recall having any cold for flu symptoms prior to that diagnosis. He thinks that this helped his chest pain.  He was referred for cardiology and echocardiography but did not present for these appointments.  He was also prescribed amlodipine for hypertension but did not know that he was to continue that indefinitely.  He doesn't think that this worked to get his BP under control, as it was 170s systolic when he checked it at [facility].   Peter Malone moved here from the [place] six months ago along with his wife and children.  Past Medical History  Diagnosis Date  . Hypertension   . Chest pain 07/03/2015    Past Surgical History  Procedure  Laterality Date  . No past surgeries       Current Outpatient Prescriptions  Medication Sig Dispense Refill  . Colchicine 0.6 MG CAPS 1 CAPSULE BY MOUTH DAILY 30 capsule 3  . hydrochlorothiazide (HYDRODIURIL) 25 MG tablet Take 1 tablet (25 mg total) by mouth daily. 30 tablet 3  . ibuprofen (ADVIL,MOTRIN) 800 MG tablet Take 1 tablet (800 mg total) by mouth every 8 (eight) hours as needed (PAIN). 60 tablet 1   No current facility-administered medications for this visit.    Allergies:   Review of patient's allergies indicates no known allergies.    Social History:  The patient  reports that he has never smoked. He does not have any smokeless tobacco history on file. He reports that he does not drink alcohol or use illicit drugs.   Family History:  The patient's family history includes Hypertension in his brother, father, and mother.    ROS:  Please see the history of present illness.   Otherwise, review of systems are positive for frequent epistaxis.   All other systems are reviewed and negative.    PHYSICAL EXAM: VS:  BP 157/103 mmHg  Pulse 72  Ht 5' (1.524 m)  Wt 175 lb 9.6 oz (79.652 kg)  BMI 34.29 kg/m2 , BMI Body mass index is 34.29 kg/(m^2). GENERAL:  Well appearing  HEENT:  Pupils equal round and reactive, fundi not visualized, oral mucosa unremarkable NECK:  No jugular venous distention, waveform within normal limits, carotid upstroke brisk and symmetric, no bruits, no thyromegaly LYMPHATICS:  No cervical adenopathy LUNGS:  Clear to auscultation bilaterally HEART:  RRR.  PMI not displaced or sustained,S1 and S2 within normal limits, no S3, no S4, no clicks, no rubs, no murmurs ABD:  Flat, positive bowel sounds normal in frequency in pitch, no bruits, no rebound, no guarding, no midline pulsatile mass, no hepatomegaly, no splenomegaly EXT:  2 plus pulses throughout, no edema, no cyanosis no clubbing SKIN:  No rashes no nodules NEURO:  Cranial nerves II through XII grossly  intact, motor grossly intact throughout PSYCH:  Cognitively intact, oriented to person place and time  EKG:  EKG is not ordered today. The ekg ordered today demonstrates sinus rhythm rate 81 bpm.  LVH with repolarization abnormality.   Recent Labs: 03/17/2015: ALT 15 03/23/2015: BUN 12; Creatinine, Ser 0.68; Hemoglobin 14.4; Platelets 258; Potassium 3.4*; Sodium 140    Lipid Panel No results found for: CHOL, TRIG, HDL, CHOLHDL, VLDL, LDLCALC, LDLDIRECT    Wt Readings from Last 3 Encounters:  07/03/15 175 lb 9.6 oz (79.652 kg)  03/23/15 172 lb (78.019 kg)  03/23/15 172 lb (78.019 kg)      ASSESSMENT AND PLAN:  # Chest pain: Peter Malone has atypical chest pain that is not exactly consistent with ischemia or pericarditis.  His EKG seems more consistent with LVH with repolarization abnormality than pericarditis.  However, his CRP was mildly elevated and he improved with ibuprofen and colchicine.  I suspect that this may be more related to musculoskeletal strain from working in the [employer].  We will restart colchicine 0.6mg  daily and ibuprofen 800 mg tid.  We will also obtain a Lexiscan Myoview and echo to evaluate for effusion and ischemia.  # Hypertension: BP is poorly-controlled.  He was not aware that he was to take this indefinitely.  We discussed the fact that he will likely need multiple agents.  We will start HCTZ 25mg  daily.  Check BMP at follow up appointment.   # Epistaxis: He reports episodes of epistaxis.  He is not anemic and cannot describe exactly how often this happens. We recommended saline nasal spray.  He can take the ibuprofen as needed.     Current medicines are reviewed at length with the patient today.  The patient does not have concerns regarding medicines.  The following changes have been made:  HCTZ, colchicine, ibuprofen.  Labs/ tests ordered today include:   Orders Placed This Encounter  Procedures  . C-reactive protein  . Myocardial Perfusion  Imaging  . ECHOCARDIOGRAM COMPLETE     Disposition:   FU with Peter Boyd C. Duke Salviaandolph, MD, [facility] in 1 month.     This note was written with the assistance of speech recognition software.  Please excuse any transcriptional errors.  Signed, Raffaele Derise C. Duke Salviaandolph, MD, [facility]  07/03/2015 9:17 AM    [facility]

## 2015-07-05 ENCOUNTER — Telehealth (HOSPITAL_COMMUNITY): Payer: Self-pay

## 2015-07-05 NOTE — Telephone Encounter (Signed)
Confirmed with Adria in interpreter services that interpreters are scheduled to be with pt's and appropriate time allotted for test.

## 2015-07-06 ENCOUNTER — Encounter: Payer: Self-pay | Admitting: Cardiovascular Disease

## 2015-07-07 ENCOUNTER — Ambulatory Visit (HOSPITAL_COMMUNITY)
Admission: RE | Admit: 2015-07-07 | Discharge: 2015-07-07 | Disposition: A | Discharge status: Home / Self Care | Payer: Medicaid Other | Source: Ambulatory Visit | Attending: Cardiology | Admitting: Cardiology

## 2015-07-07 DIAGNOSIS — R42 Dizziness and giddiness: Secondary | ICD-10-CM | POA: Insufficient documentation

## 2015-07-07 DIAGNOSIS — I10 Essential (primary) hypertension: Secondary | ICD-10-CM | POA: Diagnosis not present

## 2015-07-07 DIAGNOSIS — R0602 Shortness of breath: Secondary | ICD-10-CM | POA: Diagnosis not present

## 2015-07-07 DIAGNOSIS — I319 Disease of pericardium, unspecified: Secondary | ICD-10-CM | POA: Diagnosis not present

## 2015-07-07 DIAGNOSIS — R5383 Other fatigue: Secondary | ICD-10-CM | POA: Diagnosis not present

## 2015-07-07 DIAGNOSIS — R079 Chest pain, unspecified: Secondary | ICD-10-CM | POA: Insufficient documentation

## 2015-07-07 LAB — MYOCARDIAL PERFUSION IMAGING
CHL CUP NUCLEAR SRS: 1
CHL CUP RESTING HR STRESS: 70 {beats}/min
LV sys vol: 77 mL
LVDIAVOL: 149 mL (ref 62–150)
Peak HR: 100 {beats}/min
SDS: 0
SSS: 1
TID: 1.1

## 2015-07-07 MED ORDER — TECHNETIUM TC 99M TETROFOSMIN IV KIT
31.9000 | PACK | Freq: Once | INTRAVENOUS | Status: AC | PRN
  Administered 2015-07-07: 31.9 via INTRAVENOUS
  Filled 2015-07-07: qty 32

## 2015-07-07 MED ORDER — REGADENOSON 0.4 MG/5ML IV SOLN
0.4000 mg | Freq: Once | INTRAVENOUS | Status: AC
  Administered 2015-07-07: 0.4 mg via INTRAVENOUS

## 2015-07-07 MED ORDER — TECHNETIUM TC 99M TETROFOSMIN IV KIT
10.9000 | PACK | Freq: Once | INTRAVENOUS | Status: AC | PRN
  Administered 2015-07-07: 10.9 via INTRAVENOUS
  Filled 2015-07-07: qty 11

## 2015-07-07 MED ORDER — AMINOPHYLLINE 25 MG/ML IV SOLN
75.0000 mg | Freq: Once | INTRAVENOUS | Status: AC
  Administered 2015-07-07: 75 mg via INTRAVENOUS

## 2015-07-10 ENCOUNTER — Ambulatory Visit (HOSPITAL_COMMUNITY): Payer: Medicaid Other | Attending: Internal Medicine

## 2015-07-10 ENCOUNTER — Other Ambulatory Visit: Payer: Self-pay

## 2015-07-10 DIAGNOSIS — I119 Hypertensive heart disease without heart failure: Secondary | ICD-10-CM | POA: Insufficient documentation

## 2015-07-10 DIAGNOSIS — R079 Chest pain, unspecified: Secondary | ICD-10-CM | POA: Diagnosis not present

## 2015-07-10 DIAGNOSIS — I319 Disease of pericardium, unspecified: Secondary | ICD-10-CM | POA: Diagnosis not present

## 2015-07-10 LAB — ECHOCARDIOGRAM COMPLETE
CHL CUP DOP CALC LVOT VTI: 26.3 cm
E decel time: 268 msec
EERAT: 5.98
FS: 29 % (ref 28–44)
IV/PV OW: 1.12
LA ID, A-P, ES: 42 mm
LA diam end sys: 42 mm
LA diam index: 2.09 cm/m2
LA vol: 73 mL
LAVOLA4C: 64 mL
LAVOLIN: 36.3 mL/m2
LV TDI E'MEDIAL: 8.55
LV e' LATERAL: 11.8 cm/s
LVEEAVG: 5.98
LVEEMED: 5.98
LVOT SV: 67 mL
LVOT area: 2.54 cm2
LVOT peak grad rest: 7 mmHg
LVOT peak vel: 136 cm/s
LVOTD: 18 mm
MV Dec: 268
MV pk E vel: 70.6 m/s
MVPKAVEL: 61.7 m/s
PW: 11.2 mm — AB (ref 0.6–1.1)
TDI e' lateral: 11.8

## 2015-07-28 ENCOUNTER — Telehealth: Payer: Self-pay | Admitting: *Deleted

## 2015-07-28 NOTE — Telephone Encounter (Signed)
Advised patient of lab results  

## 2015-07-28 NOTE — Telephone Encounter (Signed)
-----   Message from Chilton Siiffany [place], MD sent at 07/06/2015  6:36 AM EDT ----- CRP is normal.  No evidence of inflammation at this time.  Continue colchicine and ibuprofen as needed.

## 2015-08-09 ENCOUNTER — Ambulatory Visit (INDEPENDENT_AMBULATORY_CARE_PROVIDER_SITE_OTHER): Payer: Medicaid Other | Admitting: Cardiovascular Disease

## 2015-08-09 ENCOUNTER — Encounter: Payer: Self-pay | Admitting: Cardiovascular Disease

## 2015-08-09 VITALS — BP 158/104 | HR 69 | Ht 69.0 in | Wt 178.2 lb

## 2015-08-09 DIAGNOSIS — I10 Essential (primary) hypertension: Secondary | ICD-10-CM | POA: Diagnosis not present

## 2015-08-09 DIAGNOSIS — Z5181 Encounter for therapeutic drug level monitoring: Secondary | ICD-10-CM

## 2015-08-09 LAB — BASIC METABOLIC PANEL WITH GFR
BUN: 12 mg/dL (ref 7–25)
CHLORIDE: 98 mmol/L (ref 98–110)
CO2: 31 mmol/L (ref 20–31)
Calcium: 9.3 mg/dL (ref 8.6–10.3)
Creat: 0.9 mg/dL (ref 0.60–1.35)
Glucose, Bld: 88 mg/dL (ref 65–99)
POTASSIUM: 3.1 mmol/L — AB (ref 3.5–5.3)
SODIUM: 139 mmol/L (ref 135–146)

## 2015-08-09 MED ORDER — AMLODIPINE BESYLATE 5 MG PO TABS: 5.0000 mg | ORAL_TABLET | Freq: Every day | ORAL | 0 refills | Status: DC

## 2015-08-09 NOTE — Progress Notes (Signed)
Cardiology Office Note   Date:  08/09/2015    ID:  Peter Malone, DOB Jul 19, 1980, MRN [id number]  PCP:  No PCP Per Patient  Cardiologist:   Chilton Si, MD   No chief complaint on file.    History of Present Illness: Peter Malone is a 35 y.o. male with hypertension and pericarditis who presents for follow up.  He was first seen 06/2015 reporting chest pain for three months.  The pain occurred while at work but not when playing sports.  He ws referred for exercise Myoview that revealed LVEF 49% and no ischemia, but his blood pressure was poorly-controlled. He also had an echo that revealed LVEF 60-65% and no diastolic dysfunction.  In March 2017 Peter Malone was diagnosed with pericarditis that was managed with colchicine and ibuprofen.  At the time he had anterior ST elevation (also LVH with repolarization abnormality).  CRP was mildly elevated at 0.7.  He does not recall having any cold for flu symptoms prior to that diagnosis. He thinks that this helped his chest pain.  At his follow up in June his EKG was felt to be more consistent with LVH with repolarization abnormalities than pericarditis.  However, his symptoms improved with colchicine, so was started back on colchicine and ibuprofen.  Since his last appointment Peter Malone has been doing well.  He denies recurrent chest pain.  His main complaint is headache. He notes that his blood pressure continues to be poorly-controlled. He started taking two tablets of hydrochlorothiazide instead of one in order to improve his blood pressure. He has continued taking ibuprofen 3 times a day as well as colchicine. He denies any vision changes, shortness of breath, or chest pain.  Peter Malone moved here from the [place] six months ago along with his wife and children.  Past Medical History:  Diagnosis Date  . Chest pain 07/03/2015  . Hypertension     Past Surgical History:  Procedure Laterality Date  . NO PAST SURGERIES       Current  Outpatient Prescriptions  Medication Sig Dispense Refill  . colchicine 0.6 MG tablet Take 0.6 mg by mouth daily as needed.    . hydrochlorothiazide (HYDRODIURIL) 25 MG tablet Take 1 tablet (25 mg total) by mouth daily. 30 tablet 3  . ibuprofen (ADVIL,MOTRIN) 800 MG tablet Take 1 tablet (800 mg total) by mouth every 8 (eight) hours as needed (PAIN). 60 tablet 1   No current facility-administered medications for this visit.     Allergies:   Review of patient's allergies indicates no known allergies.    Social History:  The patient  reports that he has never smoked. He does not have any smokeless tobacco history on file. He reports that he does not drink alcohol or use drugs.   Family History:  The patient's family history includes Hypertension in his brother, father, and mother.    ROS:  Please see the history of present illness.   Otherwise, review of systems are positive for frequent epistaxis.   All other systems are reviewed and negative.    PHYSICAL EXAM: VS:  BP (!) 158/104   Pulse 69   Ht 5\' 9"  (1.753 m)   Wt 178 lb 3.2 oz (80.8 kg)   BMI 26.32 kg/m  , BMI Body mass index is 26.32 kg/m. GENERAL:  Well appearing HEENT:  Pupils equal round and reactive, fundi not visualized, oral mucosa unremarkable NECK:  No jugular venous distention, waveform within normal limits, carotid upstroke brisk  and symmetric, no bruits, no thyromegaly LYMPHATICS:  No cervical adenopathy LUNGS:  Clear to auscultation bilaterally HEART:  RRR.  PMI not displaced or sustained,S1 and S2 within normal limits, no S3, no S4, no clicks, no rubs, I/VI systolic murmur at the RUSB ABD:  Flat, positive bowel sounds normal in frequency in pitch, no bruits, no rebound, no guarding, no midline pulsatile mass, no hepatomegaly, no splenomegaly EXT:  2 plus pulses throughout, no edema, no cyanosis no clubbing SKIN:  No rashes no nodules NEURO:  Cranial nerves II through XII grossly intact, motor grossly intact  throughout PSYCH:  Cognitively intact, oriented to person place and time  EKG:  EKG is not ordered today. The ekg ordered today demonstrates sinus rhythm rate 81 bpm.  LVH with repolarization abnormality.  Exercise Myoview 07/07/15:  The left ventricular ejection fraction is mildly decreased (45-54%).  Nuclear stress EF: 49%.  There was no ST segment deviation noted during stress.  Blood pressure demonstrated a hypertensive response to exercise.  The study is normal.  This is a low risk study.   There is no evidence of prior infarct or ischemia. Overall LVEF is mildly impaired. There are  ECG changes suggestive of LVH and hypertensive response to stress.   Echo 07/10/15: Study Conclusions  - Left ventricle: The cavity size was normal. Wall thickness was   normal. Systolic function was normal. The estimated ejection   fraction was in the range of 60% to 65%. Left ventricular   diastolic function parameters were normal. - Left atrium: The atrium was mildly to moderately dilated. - Right atrium: The atrium was mildly dilated.  Recent Labs: 03/17/2015: ALT 15 03/23/2015: BUN 12; Creatinine, Ser 0.68; Hemoglobin 14.4; Platelets 258; Potassium 3.4; Sodium 140    Lipid Panel No results found for: CHOL, TRIG, HDL, CHOLHDL, VLDL, LDLCALC, LDLDIRECT    Wt Readings from Last 3 Encounters:  08/09/15 178 lb 3.2 oz (80.8 kg)  07/07/15 175 lb (79.4 kg)  07/03/15 175 lb 9.6 oz (79.7 kg)      ASSESSMENT AND PLAN:  # Chest pain: Resolved.  This was initially felt to be due to pericarditis. However, his EKG is more consistent with LVH with repolarization abnormalities then pericarditis. He is no longer having chest pain. We will stop colchicine and ibuprofen today.  # Hypertension: BP remains poorly-controlled.  He has been taking hydrochlorothiazide regularly. In fact, he has been taking  instead of 25.  We will reduce this back to  and start amlodipine  daily.  Given that  he was taking a double dose of HCTZ as well as tid ibuprofen, we will check a BMP today.  He was asked to check his BP at home and bring the recordings to his next appointment.   Current medicines are reviewed at length with the patient today.  The patient does not have concerns regarding medicines.  The following changes have been made:  Stop colchicine and ibuprofen.  Labs/ tests ordered today include:   No orders of the defined types were placed in this encounter.    Disposition:   FU with Suhail Peloquin C. Duke Salvia, MD, [facility] in 1 2-4 weeks.   This note was written with the assistance of speech recognition software.  Please excuse any transcriptional errors.  Signed, Terrace Fontanilla C. Duke Salvia, MD, [facility]  08/09/2015 8:55 AM    [facility]

## 2015-08-09 NOTE — Patient Instructions (Addendum)
Medications  STOP Ibuprofen and Colchicine  START Amlodipine 5 mg (1 tablet ) daily  ONLY take Hydrochlorothiazide once daily.    Lab work  Your physician recommends that you return for lab work. (BMP)    Follow-up in 2-4 weeks with Dr. Duke Salvia.

## 2015-08-24 ENCOUNTER — Telehealth: Payer: Self-pay | Admitting: Cardiovascular Disease

## 2015-08-24 NOTE — Telephone Encounter (Signed)
Left message to call back  

## 2015-08-24 NOTE — Telephone Encounter (Signed)
-----   Message from Chilton Siiffany [place], MD sent at 08/18/2015  5:01 PM EDT ----- Potassium levels are low.  This is probably because he was taking so much HCTZ.  Repeat BMP at follow up appointment.

## 2015-08-24 NOTE — Telephone Encounter (Signed)
New message    Patient calling wants to only speak with Dr. Duke Salviaandolph did not want to disclose any information.

## 2015-08-29 NOTE — Telephone Encounter (Signed)
Message left for patient to call.

## 2015-09-01 ENCOUNTER — Encounter: Payer: Self-pay | Admitting: Cardiovascular Disease

## 2015-09-01 ENCOUNTER — Ambulatory Visit (INDEPENDENT_AMBULATORY_CARE_PROVIDER_SITE_OTHER): Payer: Medicaid Other | Admitting: Cardiovascular Disease

## 2015-09-01 VITALS — BP 143/101 | HR 75 | Ht 69.0 in | Wt 175.6 lb

## 2015-09-01 DIAGNOSIS — I10 Essential (primary) hypertension: Secondary | ICD-10-CM | POA: Diagnosis not present

## 2015-09-01 DIAGNOSIS — Z79899 Other long term (current) drug therapy: Secondary | ICD-10-CM | POA: Diagnosis not present

## 2015-09-01 LAB — BASIC METABOLIC PANEL
BUN: 11 mg/dL (ref 7–25)
CHLORIDE: 100 mmol/L (ref 98–110)
CO2: 25 mmol/L (ref 20–31)
CREATININE: 0.95 mg/dL (ref 0.60–1.35)
Calcium: 9.4 mg/dL (ref 8.6–10.3)
GLUCOSE: 79 mg/dL (ref 65–99)
Potassium: 3.3 mmol/L — ABNORMAL LOW (ref 3.5–5.3)
Sodium: 139 mmol/L (ref 135–146)

## 2015-09-01 MED ORDER — LISINOPRIL 10 MG PO TABS: 10.0000 mg | ORAL_TABLET | Freq: Every day | ORAL | 3 refills | Status: DC

## 2015-09-01 NOTE — Progress Notes (Signed)
Cardiology Office Note   Date:  09/01/2015    ID:  Peter Malone, DOB 1980-05-04, MRN [id number]  PCP:  No PCP Per Patient  Cardiologist:   Chilton Siiffany [place], MD   Chief Complaint  Patient presents with  . Follow-up    post echo. pt experiencing dizziness after taking the amlodipine. Pt is currenting not taking the medication, due to reaction.      History of Present Illness: Peter Malone is a 35 y.o. 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  He was first seen 06/2015 reporting chest pain for three months.  The pain occurred while at work but not when playing sports.  He ws referred for exercise Myoview that revealed LVEF 49% and no ischemia, but his blood pressure was poorly-controlled. He also had an echo that revealed LVEF 60-65% and no diastolic dysfunction.  In March 2017 [name] was diagnosed with pericarditis that was managed with colchicine and ibuprofen.  At the time he had anterior ST elevation (also LVH with repolarization abnormality).  CRP was mildly elevated at 0.7.   At his last appointment [name] was started on amlodipine due to poorly controlled blood pressure.  A BMP was checked at that appointment because he had been taking 50 mg of hydrochlorothiazide and a lots of ibuprofen.  His potassium was 3.1 at that time.  HCTZ was reduced to 25 mg daily and he was started on amlodipine. However, he has been unable to tolerate the amlodipine due to dizziness and pruritus. He checked his blood pressure at the store last week and it was 140/90. He has not noted any exertional chest pain or shortness of breath. He did have one episode of sharp, left-sided chest pain that occurred when laying down. There is no associated shortness of breath and it lasted for 1 minute.  He has not been exercising regularly but does like to ride his bicycle at times.  He has not exertional symptoms with this activity.  He has not noted any lower extremity edema,  orthopnea or PND.  Past Medical History:  Diagnosis Date  . Chest pain 07/03/2015  . Hypertension     Past Surgical History:  Procedure Laterality Date  . NO PAST SURGERIES       Current Outpatient Prescriptions  Medication Sig Dispense Refill  . hydrochlorothiazide (HYDRODIURIL) 25 MG tablet Take 1 tablet (25 mg total) by mouth daily. 30 tablet 3  . lisinopril (PRINIVIL,ZESTRIL) 10 MG tablet Take 1 tablet (10 mg total) by mouth daily. 90 tablet 3   No current facility-administered medications for this visit.     Allergies:   Norvasc [amlodipine besylate]    Social History:  The patient  reports that he has never smoked. He does not have any smokeless tobacco history on file. He reports that he does not drink alcohol or use drugs.   Family History:  The patient's family history includes Hypertension in his brother, father, and mother.    ROS:  Please see the history of present illness.   Otherwise, review of systems are positive for frequent epistaxis.   All other systems are reviewed and negative.    PHYSICAL EXAM: VS:  BP (!) 143/101   Pulse 75   Ht 5\' 9"  (1.753 m)   Wt 175 lb 9.6 oz (79.7 kg)   BMI 25.93 kg/m  , BMI Body mass index is 25.93 kg/m. GENERAL:  Well appearing HEENT:  Pupils equal round and reactive, fundi  not visualized, oral mucosa unremarkable NECK:  No jugular venous distention, waveform within normal limits, carotid upstroke brisk and symmetric, no bruits, no thyromegaly LYMPHATICS:  No cervical adenopathy LUNGS:  Clear to auscultation bilaterally HEART:  RRR.  PMI not displaced or sustained,S1 and S2 within normal limits, no S3, no S4, no clicks, no rubs, I/VI systolic murmur at the RUSB ABD:  Flat, positive bowel sounds normal in frequency in pitch, no bruits, no rebound, no guarding, no midline pulsatile mass, no hepatomegaly, no splenomegaly EXT:  2 plus pulses throughout, no edema, no cyanosis no clubbing SKIN:  No rashes no nodules NEURO:   Cranial nerves II through XII grossly intact, motor grossly intact throughout PSYCH:  Cognitively intact, oriented to person place and time  EKG:  EKG is not ordered today. The ekg ordered 03/25/15 demonstrates sinus rhythm rate 81 bpm.  LVH with repolarization abnormality.  Exercise Myoview 07/07/15:  The left ventricular ejection fraction is mildly decreased (45-54%).  Nuclear stress EF: 49%.  There was no ST segment deviation noted during stress.  Blood pressure demonstrated a hypertensive response to exercise.  The study is normal.  This is a low risk study.   There is no evidence of prior infarct or ischemia. Overall LVEF is mildly impaired. There are  ECG changes suggestive of LVH and hypertensive response to stress.   Echo 07/10/15: Study Conclusions  - Left ventricle: The cavity size was normal. Wall thickness was   normal. Systolic function was normal. The estimated ejection   fraction was in the range of 60% to 65%. Left ventricular   diastolic function parameters were normal. - Left atrium: The atrium was mildly to moderately dilated. - Right atrium: The atrium was mildly dilated.  Recent Labs: 03/17/2015: ALT 15 03/23/2015: Hemoglobin 14.4; Platelets 258 08/09/2015: BUN 12; Creat 0.90; Potassium 3.1; Sodium 139    Lipid Panel No results found for: CHOL, TRIG, HDL, CHOLHDL, VLDL, LDLCALC, LDLDIRECT    Wt Readings from Last 3 Encounters:  09/01/15 175 lb 9.6 oz (79.7 kg)  08/09/15 178 lb 3.2 oz (80.8 kg)  07/07/15 175 lb (79.4 kg)      ASSESSMENT AND PLAN:  # Chest pain: Resolved.  This was initially felt to be due to pericarditis. However, his EKG is more consistent with LVH with repolarization abnormalities then pericarditis. He is no longer having chest pain. We will stop colchicine and ibuprofen today.  # Hypertension: BP remains poorly-controlled.  He developed dizziness and pruritis on amlodipine.  We will stop this medication and add lisinopril 10 mg  daily.  We will check a BMP today due to hypokalemia.   Current medicines are reviewed at length with the patient today.  The patient does not have concerns regarding medicines.  The following changes have been made:  Stop colchicine and ibuprofen.  Labs/ tests ordered today include:   Orders Placed This Encounter  Procedures  . Basic metabolic panel     Disposition:   FU with Kimberlie Csaszar C. Duke Salviaandolph, MD, [facility] in 1 month   This note was written with the assistance of speech recognition software.  Please excuse any transcriptional errors.  Signed, Younique Casad C. Duke Salviaandolph, MD, [facility]  09/01/2015 1:19 PM    [facility]

## 2015-09-01 NOTE — Patient Instructions (Addendum)
Medication Instructions:  STOP AMLODIPINE  START LISINOPRIL 10 MG DAILY  Labwork: BMET AT [facility] ON THE FIRST FLOOR  Testing/Procedures: NONE  Follow-Up: Your physician recommends that you schedule a follow-up appointment in: 1  MONTH OV  If you need a refill on your cardiac medications before your next appointment, please call your pharmacy.

## 2015-09-04 ENCOUNTER — Telehealth: Payer: Self-pay | Admitting: *Deleted

## 2015-09-04 MED ORDER — POTASSIUM CHLORIDE CRYS ER 20 MEQ PO TBCR: 40.0000 meq | EXTENDED_RELEASE_TABLET | Freq: Every day | ORAL | 5 refills | Status: DC

## 2015-09-04 NOTE — Telephone Encounter (Signed)
-----   Message from Chilton Siiffany [place], MD sent at 09/04/2015  9:40 AM EDT ----- Potassium is low.  Start potassium 40 mEq daily

## 2015-09-04 NOTE — Telephone Encounter (Signed)
[facility] verbalized understanding

## 2015-09-05 ENCOUNTER — Ambulatory Visit: Payer: Medicaid Other | Admitting: Cardiovascular Disease

## 2015-10-04 ENCOUNTER — Encounter: Payer: Self-pay | Admitting: Cardiovascular Disease

## 2015-10-04 ENCOUNTER — Ambulatory Visit (INDEPENDENT_AMBULATORY_CARE_PROVIDER_SITE_OTHER): Payer: Medicaid Other | Admitting: Cardiovascular Disease

## 2015-10-04 VITALS — BP 139/83 | HR 78 | Ht 69.0 in | Wt 185.2 lb

## 2015-10-04 DIAGNOSIS — I10 Essential (primary) hypertension: Secondary | ICD-10-CM | POA: Diagnosis not present

## 2015-10-04 DIAGNOSIS — Z79899 Other long term (current) drug therapy: Secondary | ICD-10-CM

## 2015-10-04 NOTE — Patient Instructions (Signed)
Medication Instructions:  Your physician recommends that you continue on your current medications as directed. Please refer to the Current Medication list given to you today.  Labwork: [facility]  Testing/Procedures: none  Follow-Up: Your physician wants you to follow-up in: 1 year ov You will receive a reminder letter in the mail two months in advance. If you don't receive a letter, please call our office to schedule the follow-up appointment.  If you need a refill on your cardiac medications before your next appointment, please call your pharmacy.

## 2015-10-04 NOTE — Progress Notes (Signed)
Cardiology Office Note   Date:  10/04/2015    ID:  Calahan Pak, DOB 1980-04-09, MRN [id number]  PCP:  No PCP Per Patient  Cardiologist:   Chilton Si, MD   Chief Complaint  Patient presents with  . Follow-up    Pt states no Sx or concerns     History of Present Illness: Peter Malone is a 35 y.o. 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  Peter Malone was first seen 06/2015 reporting chest pain for three months.  The pain occurred while at work but not when playing sports.  Peter Malone ws referred for exercise Myoview that revealed LVEF 49% and no ischemia, but his blood pressure was poorly-controlled. Peter Malone also had an echo that revealed LVEF 60-65% and no diastolic dysfunction.  In March 2017 Peter Malone was diagnosed with pericarditis that was managed with colchicine and ibuprofen.  At the time Peter Malone had anterior ST elevation (also LVH with repolarization abnormality).  CRP was mildly elevated at 0.7.   At his last appointment Peter Malone was started on amlodipine due to poorly controlled blood pressure.  A BMP was checked at that appointment because Peter Malone had been taking 50 mg of hydrochlorothiazide and a lots of ibuprofen.  His potassium was 3.1 at that time.  HCTZ was reduced to 25 mg daily and Peter Malone was started on amlodipine. However, Peter Malone has been unable to tolerate the amlodipine due to dizziness and pruritus. Peter Malone checked his blood pressure at the store last week and it was 140/90. Peter Malone has not noted any exertional chest pain or shortness of breath. Peter Malone did have one episode of sharp, left-sided chest pain that occurred when laying down. There is no associated shortness of breath and it lasted for 1 minute.  Peter Malone has not been exercising regularly but does like to ride his bicycle at times.  Peter Malone has not exertional symptoms with this activity.  Peter Malone has not noted any lower extremity edema, orthopnea or PND.  Past Medical History:  Diagnosis Date  . Chest pain 07/03/2015  .  Hypertension     Past Surgical History:  Procedure Laterality Date  . NO PAST SURGERIES       Current Outpatient Prescriptions  Medication Sig Dispense Refill  . hydrochlorothiazide (HYDRODIURIL) 25 MG tablet Take 1 tablet (25 mg total) by mouth daily. 30 tablet 3  . lisinopril (PRINIVIL,ZESTRIL) 10 MG tablet Take 1 tablet (10 mg total) by mouth daily. 90 tablet 3  . potassium chloride SA (K-DUR,KLOR-CON) 20 MEQ tablet Take 2 tablets (40 mEq total) by mouth daily. 60 tablet 5   No current facility-administered medications for this visit.     Allergies:   Norvasc [amlodipine besylate]    Social History:  The patient  reports that Peter Malone has never smoked. Peter Malone does not have any smokeless tobacco history on file. Peter Malone reports that Peter Malone does not drink alcohol or use drugs.   Family History:  The patient's family history includes Hypertension in his brother, father, and mother.    ROS:  Please see the history of present illness.   Otherwise, review of systems are positive for frequent epistaxis.   All other systems are reviewed and negative.    PHYSICAL EXAM: VS:  BP 139/83   Pulse 78   Ht 5\' 9"  (1.753 m)   Wt 185 lb 3.2 oz (84 kg)   BMI 27.35 kg/m  , BMI Body mass index is 27.35 kg/m. GENERAL:  Well appearing HEENT:  Pupils equal round and reactive, fundi not visualized, oral mucosa unremarkable NECK:  No jugular venous distention, waveform within normal limits, carotid upstroke brisk and symmetric, no bruits, no thyromegaly LYMPHATICS:  No cervical adenopathy LUNGS:  Clear to auscultation bilaterally HEART:  RRR.  PMI not displaced or sustained,S1 and S2 within normal limits, no S3, no S4, no clicks, no rubs, I/VI systolic murmur at the RUSB ABD:  Flat, positive bowel sounds normal in frequency in pitch, no bruits, no rebound, no guarding, no midline pulsatile mass, no hepatomegaly, no splenomegaly EXT:  2 plus pulses throughout, no edema, no cyanosis no clubbing SKIN:  No rashes no  nodules NEURO:  Cranial nerves II through XII grossly intact, motor grossly intact throughout PSYCH:  Cognitively intact, oriented to person place and time  EKG:  EKG is ordered today. 10/04/15: Sinus rhythm.  Rate 78 bpm.  RA enlargement.  LVH with repolarization abnormality.  The ekg ordered 03/25/15 demonstrates sinus rhythm rate 81 bpm.  LVH with repolarization abnormality.  Exercise Myoview 07/07/15:  The left ventricular ejection fraction is mildly decreased (45-54%).  Nuclear stress EF: 49%.  There was no ST segment deviation noted during stress.  Blood pressure demonstrated a hypertensive response to exercise.  The study is normal.  This is a low risk study.   There is no evidence of prior infarct or ischemia. Overall LVEF is mildly impaired. There are  ECG changes suggestive of LVH and hypertensive response to stress.   Echo 07/10/15: Study Conclusions  - Left ventricle: The cavity size was normal. Wall thickness was   normal. Systolic function was normal. The estimated ejection   fraction was in the range of 60% to 65%. Left ventricular   diastolic function parameters were normal. - Left atrium: The atrium was mildly to moderately dilated. - Right atrium: The atrium was mildly dilated.  Recent Labs: 03/17/2015: ALT 15 03/23/2015: Hemoglobin 14.4; Platelets 258 09/01/2015: BUN 11; Creat 0.95; Potassium 3.3; Sodium 139    Lipid Panel No results found for: CHOL, TRIG, HDL, CHOLHDL, VLDL, LDLCALC, LDLDIRECT    Wt Readings from Last 3 Encounters:  10/04/15 185 lb 3.2 oz (84 kg)  09/01/15 175 lb 9.6 oz (79.7 kg)  08/09/15 178 lb 3.2 oz (80.8 kg)      ASSESSMENT AND PLAN:  # Chest pain:  Resolved.  This was initially felt to be due to pericarditis. However, his EKG is more consistent with LVH with repolarization abnormalities then pericarditis.   # Hypertension:  BP is well-controlled.  Continue HCTZ and lisinopril.  We will check a BMP, as Peter Malone was started on  potassium due to hypokalemia.    Current medicines are reviewed at length with the patient today.  The patient does not have concerns regarding medicines.  The following changes have been made:  Stop colchicine and ibuprofen.  Labs/ tests ordered today include:   Orders Placed This Encounter  Procedures  . Lipid panel  . Comprehensive metabolic panel  . EKG 12-Lead     Disposition:   FU with Maicy Filip C. Duke Salviaandolph, MD, [facility] in 1 month   This note was written with the assistance of speech recognition software.  Please excuse any transcriptional errors.  Signed, Kyrah Schiro C. Duke Salviaandolph, MD, [facility]  10/04/2015 4:03 PM    [facility]

## 2015-10-07 ENCOUNTER — Other Ambulatory Visit: Payer: Self-pay | Admitting: Cardiovascular Disease

## 2015-10-09 NOTE — Telephone Encounter (Signed)
Review for refill. 

## 2015-10-25 ENCOUNTER — Telehealth: Payer: Self-pay | Admitting: Cardiovascular Disease

## 2015-10-25 ENCOUNTER — Telehealth: Payer: Self-pay | Admitting: *Deleted

## 2015-10-25 MED ORDER — POTASSIUM CHLORIDE CRYS ER 20 MEQ PO TBCR: 40.0000 meq | EXTENDED_RELEASE_TABLET | Freq: Every day | ORAL | 3 refills | Status: DC

## 2015-10-25 MED ORDER — LISINOPRIL 10 MG PO TABS: 10.0000 mg | ORAL_TABLET | Freq: Every day | ORAL | 3 refills | Status: DC

## 2015-10-25 MED ORDER — HYDROCHLOROTHIAZIDE 25 MG PO TABS: 25.0000 mg | ORAL_TABLET | Freq: Every day | ORAL | 3 refills | Status: DC

## 2015-10-25 NOTE — Telephone Encounter (Addendum)
Spoke with patient regarding labs ordered at last ov, stated he would go tomorrow to have them done Patient did state that he had been having some neck pain off and on over the last few days Advised to follow up with PCP. Stated he did not have one Advised to needs to get established with one/ follow up urgent care until established PCP

## 2015-10-25 NOTE — Telephone Encounter (Signed)
New message ° ° °Patient returning call back to nurse.  °

## 2015-10-25 NOTE — Telephone Encounter (Signed)
LMTCB-why is pt calling

## 2015-10-26 NOTE — Telephone Encounter (Signed)
LMTCB

## 2015-10-27 NOTE — Telephone Encounter (Signed)
LM,TCB

## 2015-11-08 ENCOUNTER — Ambulatory Visit: Payer: Medicaid Other | Admitting: Cardiovascular Disease

## 2016-11-22 ENCOUNTER — Other Ambulatory Visit: Payer: Self-pay | Admitting: Cardiovascular Disease

## 2016-11-22 NOTE — Telephone Encounter (Signed)
Please review for refill. Thanks!  

## 2016-11-27 ENCOUNTER — Encounter: Payer: Self-pay | Admitting: *Deleted

## 2016-11-27 ENCOUNTER — Ambulatory Visit (INDEPENDENT_AMBULATORY_CARE_PROVIDER_SITE_OTHER): Payer: BC Managed Care – PPO | Admitting: Physician Assistant

## 2016-11-27 ENCOUNTER — Encounter: Payer: Self-pay | Admitting: Physician Assistant

## 2016-11-27 VITALS — BP 140/104 | HR 65 | Ht 69.0 in | Wt 179.0 lb

## 2016-11-27 DIAGNOSIS — I10 Essential (primary) hypertension: Secondary | ICD-10-CM | POA: Diagnosis not present

## 2016-11-27 DIAGNOSIS — R079 Chest pain, unspecified: Secondary | ICD-10-CM

## 2016-11-27 MED ORDER — HYDROCHLOROTHIAZIDE 25 MG PO TABS: 25.0000 mg | ORAL_TABLET | Freq: Every day | ORAL | 3 refills | Status: AC

## 2016-11-27 MED ORDER — LISINOPRIL 10 MG PO TABS: 10.0000 mg | ORAL_TABLET | Freq: Every day | ORAL | 3 refills | Status: AC

## 2016-11-27 NOTE — Patient Instructions (Signed)
Medication Instructions: Your physician recommends that you continue on your current medications as directed. Please refer to the Current Medication list given to you today.  If you need a refill on your cardiac medications before your next appointment, please call your pharmacy.   Labwork: Your physician recommends that you have the following CMET and Lipid   Follow-Up: Your physician wants you to follow-up in 12 month with Dr. Duke Salviaandolph. You will receive a reminder letter in the mail two months in advance. If you don't receive a letter, please call our office at [phone] to schedule this follow-up appointment.   [facility]!!

## 2016-11-27 NOTE — Progress Notes (Signed)
Cardiology Office Note   Date:  11/27/2016   ID:  Peter ChinaSaidi Gul, DOB 26-May-1980, MRN [id number]  PCP:  Patient, No Pcp Per  Cardiologist: Dr. Duke Salviaandolph, 10/04/2015 Annesha Delgreco, Bjorn Loserhonda, PA-C     History of Present Illness: Peter Malone is a 36 y.o. male with a history of HTN, pericarditis 03/2015 managed with colchicine and ibuprofen; chest pain 06/2015 w/ EF 49% and no ischemia by MV, EF 60-65% by echo  Peter ChinaSaidi Buerger presents for cardiology follow up.  He exercises intermittently. He does not get CP w/ exercise or at rest.   Denies DOE, orthopnea or PND. No LE edema.  His heart rate increases with exercise. He also has occasional palpitations sitting still, will feel his heart speed up for a few seconds. No sx associated w/ this.    He has some pain to the medial aspect of his R scapula. It is worse with movement.   He was diagnosed with HTN in his 820s, in [place]    Past Medical History:  Diagnosis Date  . Chest pain with low risk for cardiac etiology 07/03/2015   MV w/ EF 49% and no ischemia or scar, EF 60-65% by Echo  . Hypertension   . Pericarditis 03/2015   Rx w/ colchicine and ibuprofen    Past Surgical History:  Procedure Laterality Date  . NO PAST SURGERIES      Current Outpatient Medications  Medication Sig Dispense Refill  . hydrochlorothiazide (HYDRODIURIL) 25 MG tablet Take 1 tablet (25 mg total) by mouth daily. 90 tablet 3  . lisinopril (PRINIVIL,ZESTRIL) 10 MG tablet Take 1 tablet (10 mg total) by mouth daily. 90 tablet 3   No current facility-administered medications for this visit.     Allergies:   Norvasc [amlodipine besylate]    Social History:  The patient  reports that  has never smoked. he has never used smokeless tobacco. He reports that he does not drink alcohol or use drugs.   Family History:  The patient's family history includes Hypertension in his brother, father, and mother.    ROS:  Please see the history of  present illness. All other systems are reviewed and negative.    PHYSICAL EXAM: VS:  BP (!) 140/104   Pulse 65   Ht 5\' 9"  (1.753 m)   Wt 179 lb (81.2 kg)   BMI 26.43 kg/m  , BMI Body mass index is 26.43 kg/m. GEN: Well nourished, well developed, male in no acute distress  HEENT: normal for age  Neck: no JVD, no carotid bruit, no masses Cardiac: RRR; soft murmur, no rubs, or gallops Respiratory:  clear to auscultation bilaterally, normal work of breathing GI: soft, nontender, nondistended, + BS MS: no deformity or atrophy; no edema; distal pulses are 2+ in all 4 extremities   Skin: warm and dry, no rash Neuro:  Strength and sensation are intact Psych: euthymic mood, full affect   EKG:  EKG is ordered today. The ekg ordered today demonstrates SR, HR 65, no acute or ischemic changes, normal intervals   Recent Labs: No results found for requested labs within last 8760 hours.    Lipid Panel No results found for: CHOL, TRIG, HDL, CHOLHDL, VLDL, LDLCALC, LDLDIRECT   Wt Readings from Last 3 Encounters:  11/27/16 179 lb (81.2 kg)  10/04/15 185 lb 3.2 oz (84 kg)  09/01/15 175 lb 9.6 oz (79.7 kg)     Other studies Reviewed: Additional studies/ records that were reviewed today  include: office notes and testing.  ASSESSMENT AND PLAN:  1.  HTN: Blood pressure was previously well controlled on lisinopril and HCTZ.  Refill these.  He is encouraged to get more regular exercise and follow his blood pressure.  2.  Lipid status: We have no profile in the system.  Dr. Duke Salviaandolph had ordered it at his previous office visit.  He may have eaten some today but I feel the profile will still be useful, we will go ahead and draw it today.  3.  He is encouraged to exercise more regularly, watch the sodium in the foods that he eats.  The connection between lifestyle and pressure control was emphasized.   Current medicines are reviewed at length with the patient today.  The patient does not have  concerns regarding medicines.  The following changes have been made: Renew prescriptions  Labs/ tests ordered today include:   Orders Placed This Encounter  Procedures  . Comprehensive metabolic panel  . Lipid panel  . EKG 12-Lead     Disposition:   FU with Dr. Duke Salviaandolph  Signed, Reid Nawrot, Bjorn LoserRhonda, PA-C  11/27/2016 3:01 PM    [facility] Phone: [phone]; Fax: [phone]  This note was written with the assistance of speech recognition software. Please excuse any transcriptional errors.

## 2016-11-28 LAB — COMPREHENSIVE METABOLIC PANEL
A/G RATIO: 1.4 (ref 1.2–2.2)
ALBUMIN: 4.2 g/dL (ref 3.5–5.5)
ALK PHOS: 47 IU/L (ref 39–117)
ALT: 25 IU/L (ref 0–44)
AST: 32 IU/L (ref 0–40)
BILIRUBIN TOTAL: 0.6 mg/dL (ref 0.0–1.2)
BUN / CREAT RATIO: 9 (ref 9–20)
BUN: 8 mg/dL (ref 6–20)
CHLORIDE: 103 mmol/L (ref 96–106)
CO2: 27 mmol/L (ref 20–29)
CREATININE: 0.87 mg/dL (ref 0.76–1.27)
Calcium: 9.2 mg/dL (ref 8.7–10.2)
GFR calc Af Amer: 127 mL/min/{1.73_m2} (ref >59)
GFR calc non Af Amer: 109 mL/min/{1.73_m2} (ref >59)
GLOBULIN, TOTAL: 3 g/dL (ref 1.5–4.5)
Glucose: 75 mg/dL (ref 65–99)
POTASSIUM: 3.6 mmol/L (ref 3.5–5.2)
SODIUM: 143 mmol/L (ref 134–144)
Total Protein: 7.2 g/dL (ref 6.0–8.5)

## 2016-11-28 LAB — LIPID PANEL
CHOLESTEROL TOTAL: 179 mg/dL (ref 100–199)
Chol/HDL Ratio: 3.4 ratio (ref 0.0–5.0)
HDL: 52 mg/dL (ref >39)
LDL CALC: 114 mg/dL — AB (ref 0–99)
Triglycerides: 66 mg/dL (ref 0–149)
VLDL Cholesterol Cal: 13 mg/dL (ref 5–40)

## 2016-12-04 ENCOUNTER — Telehealth: Payer: Self-pay | Admitting: Physician Assistant

## 2016-12-04 NOTE — Telephone Encounter (Signed)
Use of interpreter - Swahili - interpreter ID [id number]   Spoke w patient. States he only called earlier to schedule appt, was not expecting call back. States he is fine to follow up Monday as scheduled. Was seen last week by Theodore Demarkhonda Barrett, having some problems with chest discomfort. He is not experiencing any new/other symptoms, but wants to make sure his heart is OK.  States it "feels like I get a sharp pain in my heart sometimes with breathing in". I asked about shortness of breath, fatigue, neck pain, etc - pt denies. He did not elicit any other information.  Advised to go to hospital if his symptoms are worse over the weekend or if he develops new shortness of breath. O/w, recommended he keep return OV as scheduled. Pt agreeable, voiced thanks for call.

## 2016-12-04 NOTE — Telephone Encounter (Signed)
Sounds like a reasonable plan.  Bryan Lemmaavid Jisella Ashenfelter, MD

## 2016-12-04 NOTE — Telephone Encounter (Signed)
New message  Patient states he has heart pain when he takes a deep breath. Please call   Pt c/o of Chest Pain: STAT if CP now or developed within 24 hours  1. Are you having CP right now? NO  2. Are you experiencing any other symptoms (ex. SOB, nausea, vomiting, sweating)? NO  3. How long have you been experiencing CP? 1 week  4. Is your CP continuous or coming and going? Continue and going  5. Have you taken Nitroglycerin? NO ?

## 2016-12-09 ENCOUNTER — Ambulatory Visit: Payer: BC Managed Care – PPO | Admitting: Physician Assistant

## 2016-12-09 NOTE — Progress Notes (Deleted)
Cardiology Office Note   Date:  12/09/2016   ID:  Peter Malone, DOB [birth year], MRN [id number]  PCP:  Patient, No Pcp Per  Cardiologist: Dr. Duke Salviaandolph, 10/04/2015 Theodore Demarkhonda Jessia Kief, PA-C   No chief complaint on file.   History of Present Illness: Peter Malone is a 36 y.o. male with a history of HTN, pericarditis 03/2015 managed with colchicine and ibuprofen; chest pain 06/2015 w/ EF 49% and no ischemia by MV, EF 60-65% by echo  11/27/2016 office visit lisinopril and HCTZ refilled, lipid profile performed  Peter Malone presents for ***   Past Medical History:  Diagnosis Date  . Chest pain with low risk for cardiac etiology 07/03/2015   MV w/ EF 49% and no ischemia or scar, EF 60-65% by Echo  . Hypertension   . Pericarditis 03/2015   Rx w/ colchicine and ibuprofen    Past Surgical History:  Procedure Laterality Date  . NO PAST SURGERIES      Current Outpatient Medications  Medication Sig Dispense Refill  . hydrochlorothiazide (HYDRODIURIL) 25 MG tablet Take 1 tablet (25 mg total) daily by mouth. 90 tablet 3  . lisinopril (PRINIVIL,ZESTRIL) 10 MG tablet Take 1 tablet (10 mg total) daily by mouth. 90 tablet 3   No current facility-administered medications for this visit.     Allergies:   Norvasc [amlodipine besylate]    Social History:  The patient  reports that  has never smoked. he has never used smokeless tobacco. He reports that he does not drink alcohol or use drugs.   Family History:  The patient's family history includes Hypertension in his brother, father, and mother.    ROS:  Please see the history of present illness. All other systems are reviewed and negative.    PHYSICAL EXAM: VS:  There were no vitals taken for this visit. , BMI There is no height or weight on file to calculate BMI. GEN: Well nourished, well developed, male in no acute distress  HEENT: normal for age  Neck: no JVD, no carotid bruit, no masses Cardiac: RRR; no murmur, no rubs, or  gallops Respiratory:  clear to auscultation bilaterally, normal work of breathing GI: soft, nontender, nondistended, + BS MS: no deformity or atrophy; no edema; distal pulses are 2+ in all 4 extremities   Skin: warm and dry, no rash Neuro:  Strength and sensation are intact Psych: euthymic mood, full affect   EKG:  EKG {ACTION; IS/IS [id number]} ordered today. The ekg ordered today demonstrates ***   Recent Labs: 11/27/2016: ALT 25; BUN 8; Creatinine, Ser 0.87; Potassium 3.6; Sodium 143    Lipid Panel    Component Value Date/Time   CHOL 179 11/27/2016 1153   TRIG 66 11/27/2016 1153   HDL 52 11/27/2016 1153   CHOLHDL 3.4 11/27/2016 1153   LDLCALC 114 (H) 11/27/2016 1153     Wt Readings from Last 3 Encounters:  11/27/16 179 lb (81.2 kg)  10/04/15 185 lb 3.2 oz (84 kg)  09/01/15 175 lb 9.6 oz (79.7 kg)     Other studies Reviewed: Additional studies/ records that were reviewed today include: ***.  ASSESSMENT AND PLAN:  1.  ***   Current medicines are reviewed at length with the patient today.  The patient {ACTIONS; HAS/DOES NOT HAVE:19233} concerns regarding medicines.  The following changes have been made:  {PLAN; NO CHANGE:13088:s}  Labs/ tests ordered today include: *** No orders of the defined types were placed in this encounter.    Disposition:  FU with Dr. Duke Salviaandolph  Signed, Theodore Demarkhonda Judine Arciniega, PA-C  12/09/2016 7:53 AM    [facility] Phone: [phone]; Fax: [phone]  This note was written with the assistance of speech recognition software. Please excuse any transcriptional errors.

## 2017-05-30 ENCOUNTER — Ambulatory Visit (HOSPITAL_COMMUNITY)
Admission: EM | Admit: 2017-05-30 | Discharge: 2017-05-30 | Disposition: A | Discharge status: Other Acute Inpatient Hospital | Payer: BC Managed Care – PPO

## 2017-12-29 IMAGING — NM NM MISC PROCEDURE
6 series · 36 of 36 positions shown · non-contrast
Comparison: none

[Series 1: wbr rest · 6.40mm/px · 6 of 64 frames shown]
[frame 6/64]
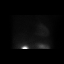
[frame 16/64]
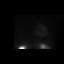
[frame 27/64]
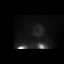
[frame 38/64]
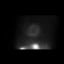
[frame 48/64]
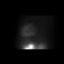
[frame 59/64]
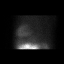

[Series 1: wbr_r-proj_st wbr rest · 6.40mm/px · 6 of 64 frames shown]
[frame 6/64]
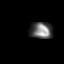
[frame 16/64]
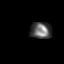
[frame 27/64]
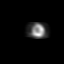
[frame 38/64]
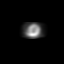
[frame 48/64]
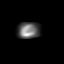
[frame 59/64]
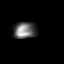

[Series 2: wbr_s-proj_st wbr stress-gsp · 6.40mm/px · 6 of 512 frames shown]
[frame 43/512]
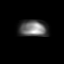
[frame 128/512]
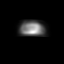
[frame 214/512]
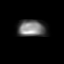
[frame 299/512]
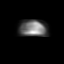
[frame 384/512]
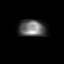
[frame 470/512]
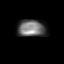

[Series 2: wbr stress-gsp · 6.40mm/px · 6 of 512 frames shown]
[frame 43/512]
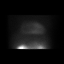
[frame 128/512]
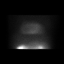
[frame 214/512]
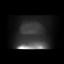
[frame 299/512]
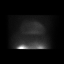
[frame 384/512]
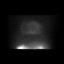
[frame 470/512]
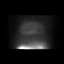

[Series 3: wbr stress-sum-em · 6.40mm/px · 6 of 64 frames shown]
[frame 6/64]
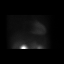
[frame 16/64]
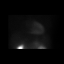
[frame 27/64]
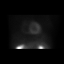
[frame 38/64]
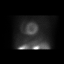
[frame 48/64]
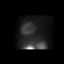
[frame 59/64]
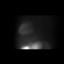

[Series 3: wbr_s-proj_st wbr stress-sum-em · 6.40mm/px · 6 of 64 frames shown]
[frame 6/64]
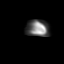
[frame 16/64]
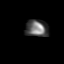
[frame 27/64]
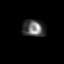
[frame 38/64]
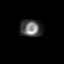
[frame 48/64]
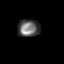
[frame 59/64]
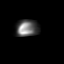

[36 of 36 positions shown; findings below may reference images not displayed]

Canned report from images found in remote index.

Refer to host system for actual result text.

## 2018-08-24 ENCOUNTER — Telehealth: Payer: Self-pay | Admitting: Cardiovascular Disease

## 2018-08-24 NOTE — Telephone Encounter (Signed)
Recall - Pt has not had any more problems, doesnt need visit 08-24-18
